# Patient Record
Sex: Female | Born: 1990 | Race: White | Hispanic: No | Marital: Single | Smoking: Current every day smoker
Health system: Southern US, Community
[De-identification: ages and names within clinical notes are randomized; demographics above are authoritative.]

## PROBLEM LIST (undated history)

## (undated) ENCOUNTER — Inpatient Hospital Stay (HOSPITAL_COMMUNITY): Payer: Self-pay

## (undated) DIAGNOSIS — Z789 Other specified health status: Secondary | ICD-10-CM

---
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
## 2012-06-25 ENCOUNTER — Encounter (HOSPITAL_COMMUNITY): Payer: Self-pay | Admitting: *Deleted

## 2012-06-25 ENCOUNTER — Emergency Department (HOSPITAL_COMMUNITY)
Admission: EM | Admit: 2012-06-25 | Discharge: 2012-06-25 | Disposition: A | Discharge status: Home / Self Care | Payer: Self-pay | Attending: Emergency Medicine | Admitting: Emergency Medicine

## 2012-06-25 DIAGNOSIS — Z79899 Other long term (current) drug therapy: Secondary | ICD-10-CM | POA: Insufficient documentation

## 2012-06-25 DIAGNOSIS — Z7982 Long term (current) use of aspirin: Secondary | ICD-10-CM | POA: Insufficient documentation

## 2012-06-25 DIAGNOSIS — F17200 Nicotine dependence, unspecified, uncomplicated: Secondary | ICD-10-CM | POA: Insufficient documentation

## 2012-06-25 DIAGNOSIS — R221 Localized swelling, mass and lump, neck: Secondary | ICD-10-CM | POA: Insufficient documentation

## 2012-06-25 DIAGNOSIS — K047 Periapical abscess without sinus: Secondary | ICD-10-CM | POA: Insufficient documentation

## 2012-06-25 DIAGNOSIS — R220 Localized swelling, mass and lump, head: Secondary | ICD-10-CM | POA: Insufficient documentation

## 2012-06-25 MED ORDER — OXYCODONE-ACETAMINOPHEN 5-325 MG PO TABS: 1.0000 | ORAL_TABLET | ORAL | Status: DC | PRN

## 2012-06-25 MED ORDER — LIDOCAINE HCL (PF) 1 % IJ SOLN: 2.0000 mL | Freq: Once | INTRAMUSCULAR | Status: DC

## 2012-06-25 MED ORDER — HYDROMORPHONE HCL PF 1 MG/ML IJ SOLN
1.0000 mg | Freq: Once | INTRAMUSCULAR | Status: AC
  Administered 2012-06-25: 1 mg via INTRAMUSCULAR
  Filled 2012-06-25: qty 1

## 2012-06-25 NOTE — ED Provider Notes (Signed)
History     CSN: [id number]  Arrival date & time 06/25/12  1101   First MD Initiated Contact with Patient 06/25/12 1107      Chief Complaint  Patient presents with  . Dental Pain  . Facial Swelling     Patient is a 22 y.o. female presenting with tooth pain. The history is provided by the patient.  Dental PainThe primary symptoms include mouth pain. Primary symptoms do not include fever or shortness of breath. The symptoms began 3 to 5 days ago. The symptoms are worsening. The symptoms are new. The symptoms occur constantly.  Additional symptoms include: facial swelling. Additional symptoms do not include: trouble swallowing.  pt reports right sided facial pain and swelling for past 3 days.  No visual changes.  No difficulty swallowing.  No fever.  No vomiting.  She reports she saw a dentist who put her on amoxicillin but she reports he didn't know what was going on.   History reviewed. No pertinent past medical history.  Past Surgical History  Procedure Laterality Date  . Cesarean section      No family history on file.  History  Substance Use Topics  . Smoking status: Current Every Day Smoker -- 0.50 packs/day    Types: Cigarettes  . Smokeless tobacco: Not on file  . Alcohol Use: Yes    OB History   Grav Para Term Preterm Abortions TAB SAB Ect Mult Living                  Review of Systems  Constitutional: Negative for fever.  HENT: Positive for facial swelling and dental problem. Negative for trouble swallowing.   Eyes: Negative for photophobia and visual disturbance.       No diplopia   Respiratory: Negative for shortness of breath.   Cardiovascular: Negative for chest pain.  Gastrointestinal: Negative for vomiting.  Neurological: Negative for weakness.  Psychiatric/Behavioral: Negative for agitation.  All other systems reviewed and are negative.    Allergies  Review of patient's allergies indicates no known allergies.  Home Medications   Current  Outpatient Rx  Name  Route  Sig  Dispense  Refill  . acetaminophen (TYLENOL) 500 MG tablet   Oral   Take 500 mg by mouth every 6 (six) hours as needed for pain.         . Aspirin-Salicylamide-Caffeine (BC HEADACHE POWDER PO)   Oral   Take 1 packet by mouth once.         Marland Kitchen HYDROcodone-acetaminophen (NORCO/VICODIN) 5-325 MG per tablet   Oral   Take 1 tablet by mouth every 6 (six) hours as needed for pain (for tooth pain.).         Marland Kitchen ibuprofen (ADVIL,MOTRIN) 800 MG tablet   Oral   Take 800 mg by mouth every 8 (eight) hours as needed for pain.           BP 142/98  Pulse 80  Temp(Src) 99.5 F (37.5 C) (Oral)  Ht 5\' 2"  (1.575 m)  SpO2 100%  LMP 06/01/2012  Physical Exam CONSTITUTIONAL: Well developed/well nourished HEAD AND FACE: Normocephalic/atraumatic EYES: EOMI/PERRL ENMT: Mucous membranes moist. No trismus.  Tenderness/swelling to right upper gingiva.  No active bleeding/drainage NECK: supple no meningeal signs CV: S1/S2 noted, no murmurs/rubs/gallops noted LUNGS: Lungs are clear to auscultation bilaterally, no apparent distress ABDOMEN: soft, nontender, no rebound or guarding NEURO: Pt is awake/alert, moves all extremitiesx4 EXTREMITIES: pulses normal, full ROM SKIN: warm, color normal PSYCH: no abnormalities  of mood noted  ED Course  INCISION AND DRAINAGE Date/Time: 06/25/2012 1:33 PM Performed by: Joya Gaskins Authorized by: Joya Gaskins Consent: Verbal consent obtained. Consent given by: patient Patient identity confirmed: verbally with patient Time out: Immediately prior to procedure a "time out" was called to verify the correct patient, procedure, equipment, support staff and site/side marked as required. Type: abscess Body area: head/neck (right upper gingival abscess) Anesthesia: nerve block Patient sedated: no Scalpel size: 11 Incision type: single straight Complexity: simple Drainage: purulent Drainage amount: moderate Wound  treatment: wound left open Patient tolerance: Patient tolerated the procedure well with no immediate complications.   NERVE BLOCK - PROCEDURE NOTE Right Infra-orbital nerve block Procedure explained to patient She agrees to procedure 3ml of lidocaine 1% without epinephrine injected Pt tolerated well with appropriate anesthesia No visual complaints reported     2:26 PM Pt tolerated procedure well, no distress Unable to contact dental.  She was given referral info.  Do not feel imaging is necessary at this time   MDM  Nursing notes including past medical history and social history reviewed and considered in documentation         Joya Gaskins, MD 06/25/12 1427

## 2012-06-25 NOTE — ED Notes (Signed)
Pt is here for follow up after increased right facial swelling.  Pt was seen here for dental pain two days ago and placed on antibiotics.  Pt has swelling to her right face, cheek and under her eye.  Pt also continues to have pain

## 2012-06-25 NOTE — ED Notes (Signed)
MD at bedside. 

## 2015-09-21 ENCOUNTER — Encounter (HOSPITAL_COMMUNITY): Payer: Self-pay | Admitting: Nurse Practitioner

## 2015-09-21 ENCOUNTER — Emergency Department (HOSPITAL_COMMUNITY)
Admission: EM | Admit: 2015-09-21 | Discharge: 2015-09-21 | Disposition: A | Discharge status: Home / Self Care | Payer: Self-pay | Attending: Emergency Medicine | Admitting: Emergency Medicine

## 2015-09-21 DIAGNOSIS — O99331 Smoking (tobacco) complicating pregnancy, first trimester: Secondary | ICD-10-CM | POA: Insufficient documentation

## 2015-09-21 DIAGNOSIS — Z3A00 Weeks of gestation of pregnancy not specified: Secondary | ICD-10-CM | POA: Insufficient documentation

## 2015-09-21 DIAGNOSIS — O200 Threatened abortion: Secondary | ICD-10-CM | POA: Insufficient documentation

## 2015-09-21 DIAGNOSIS — F17210 Nicotine dependence, cigarettes, uncomplicated: Secondary | ICD-10-CM | POA: Insufficient documentation

## 2015-09-21 LAB — BASIC METABOLIC PANEL
ANION GAP: 11 (ref 5–15)
CO2: 20 mmol/L — AB (ref 22–32)
CREATININE: 0.62 mg/dL (ref 0.44–1.00)
Calcium: 9.2 mg/dL (ref 8.9–10.3)
Chloride: 110 mmol/L (ref 101–111)
GFR calc non Af Amer: >60 mL/min (ref >60)
GLUCOSE: 90 mg/dL (ref 65–99)
POTASSIUM: 3.6 mmol/L (ref 3.5–5.1)
Sodium: 141 mmol/L (ref 135–145)

## 2015-09-21 LAB — CBC
HEMATOCRIT: 36.5 % (ref 36.0–46.0)
Hemoglobin: 12.7 g/dL (ref 12.0–15.0)
MCH: 31.4 pg (ref 26.0–34.0)
MCHC: 34.8 g/dL (ref 30.0–36.0)
MCV: 90.3 fL (ref 78.0–100.0)
Platelets: 331 10*3/uL (ref 150–400)
RBC: 4.04 MIL/uL (ref 3.87–5.11)
RDW: 11.9 % (ref 11.5–15.5)
WBC: 8.9 10*3/uL (ref 4.0–10.5)

## 2015-09-21 LAB — I-STAT BETA HCG BLOOD, ED (MC, WL, AP ONLY): I-stat hCG, quantitative: >2000 m[IU]/mL — ABNORMAL HIGH (ref <5)

## 2015-09-21 LAB — HCG, QUANTITATIVE, PREGNANCY: HCG, BETA CHAIN, QUANT, S: 12231 m[IU]/mL — AB (ref <5)

## 2015-09-21 NOTE — ED Notes (Signed)
Pt c/o onset severe abd cramps today. She was at her gyn office on Friday and they confirmed she was having a miscarriage. She began to have vaginal bleeding Friday that has persisted since. She became concerned today because of the severe abd cramping. She is alert and breathing easily

## 2015-09-21 NOTE — ED Notes (Signed)
Patient called again in waiting room with no response.

## 2015-09-21 NOTE — ED Notes (Signed)
Patient called in the waiting room and in restroom without response.

## 2016-02-12 ENCOUNTER — Encounter (HOSPITAL_COMMUNITY): Payer: Self-pay | Admitting: Emergency Medicine

## 2016-02-12 ENCOUNTER — Emergency Department (HOSPITAL_COMMUNITY): Payer: Self-pay

## 2016-02-12 ENCOUNTER — Emergency Department (HOSPITAL_COMMUNITY)
Admission: EM | Admit: 2016-02-12 | Discharge: 2016-02-12 | Disposition: A | Discharge status: Home / Self Care | Payer: Self-pay | Attending: Emergency Medicine | Admitting: Emergency Medicine

## 2016-02-12 DIAGNOSIS — Y92410 Unspecified street and highway as the place of occurrence of the external cause: Secondary | ICD-10-CM | POA: Insufficient documentation

## 2016-02-12 DIAGNOSIS — S301XXA Contusion of abdominal wall, initial encounter: Secondary | ICD-10-CM | POA: Insufficient documentation

## 2016-02-12 DIAGNOSIS — F17210 Nicotine dependence, cigarettes, uncomplicated: Secondary | ICD-10-CM | POA: Insufficient documentation

## 2016-02-12 DIAGNOSIS — Y999 Unspecified external cause status: Secondary | ICD-10-CM | POA: Insufficient documentation

## 2016-02-12 DIAGNOSIS — Y939 Activity, unspecified: Secondary | ICD-10-CM | POA: Insufficient documentation

## 2016-02-12 DIAGNOSIS — R0602 Shortness of breath: Secondary | ICD-10-CM | POA: Insufficient documentation

## 2016-02-12 DIAGNOSIS — R51 Headache: Secondary | ICD-10-CM | POA: Insufficient documentation

## 2016-02-12 DIAGNOSIS — S0093XA Contusion of unspecified part of head, initial encounter: Secondary | ICD-10-CM | POA: Insufficient documentation

## 2016-02-12 DIAGNOSIS — S20219A Contusion of unspecified front wall of thorax, initial encounter: Secondary | ICD-10-CM | POA: Insufficient documentation

## 2016-02-12 DIAGNOSIS — Z7982 Long term (current) use of aspirin: Secondary | ICD-10-CM | POA: Insufficient documentation

## 2016-02-12 DIAGNOSIS — M7918 Myalgia, other site: Secondary | ICD-10-CM

## 2016-02-12 LAB — CBC WITH DIFFERENTIAL/PLATELET
BASOS ABS: 0 10*3/uL (ref 0.0–0.1)
BASOS PCT: 1 %
EOS PCT: 2 %
Eosinophils Absolute: 0.2 10*3/uL (ref 0.0–0.7)
HEMATOCRIT: 37.5 % (ref 36.0–46.0)
Hemoglobin: 12.8 g/dL (ref 12.0–15.0)
Lymphocytes Relative: 40 %
Lymphs Abs: 3.2 10*3/uL (ref 0.7–4.0)
MCH: 31.4 pg (ref 26.0–34.0)
MCHC: 34.1 g/dL (ref 30.0–36.0)
MCV: 92.1 fL (ref 78.0–100.0)
MONO ABS: 0.6 10*3/uL (ref 0.1–1.0)
MONOS PCT: 7 %
NEUTROS ABS: 4 10*3/uL (ref 1.7–7.7)
Neutrophils Relative %: 50 %
PLATELETS: 269 10*3/uL (ref 150–400)
RBC: 4.07 MIL/uL (ref 3.87–5.11)
RDW: 12.4 % (ref 11.5–15.5)
WBC: 7.9 10*3/uL (ref 4.0–10.5)

## 2016-02-12 LAB — BASIC METABOLIC PANEL
Anion gap: 3 — ABNORMAL LOW (ref 5–15)
BUN: 8 mg/dL (ref 6–20)
CALCIUM: 8.7 mg/dL — AB (ref 8.9–10.3)
CO2: 25 mmol/L (ref 22–32)
CREATININE: 0.54 mg/dL (ref 0.44–1.00)
Chloride: 108 mmol/L (ref 101–111)
GLUCOSE: 87 mg/dL (ref 65–99)
Potassium: 3.8 mmol/L (ref 3.5–5.1)
Sodium: 136 mmol/L (ref 135–145)

## 2016-02-12 LAB — POC URINE PREG, ED: PREG TEST UR: NEGATIVE

## 2016-02-12 MED ORDER — KETOROLAC TROMETHAMINE 30 MG/ML IJ SOLN
15.0000 mg | Freq: Once | INTRAMUSCULAR | Status: AC
  Administered 2016-02-12: 15 mg via INTRAVENOUS
  Filled 2016-02-12: qty 1

## 2016-02-12 MED ORDER — HYDROCODONE-ACETAMINOPHEN 5-325 MG PO TABS: 1.0000 | ORAL_TABLET | ORAL | 0 refills | Status: DC | PRN

## 2016-02-12 MED ORDER — IBUPROFEN 600 MG PO TABS: 600.0000 mg | ORAL_TABLET | Freq: Four times a day (QID) | ORAL | 0 refills | Status: DC | PRN

## 2016-02-12 MED ORDER — IOPAMIDOL (ISOVUE-300) INJECTION 61%
100.0000 mL | Freq: Once | INTRAVENOUS | Status: AC | PRN
  Administered 2016-02-12: 100 mL via INTRAVENOUS

## 2016-02-12 NOTE — Discharge Instructions (Signed)
You may take the hydrocodone prescribed for pain relief.  This will make you drowsy - do not drive within 4 hours of taking this medication. ° °
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
## 2016-05-13 HISTORY — DX: Maternal care for unspecified type scar from previous cesarean delivery: O34.219

## 2017-03-24 DIAGNOSIS — O99013 Anemia complicating pregnancy, third trimester: Secondary | ICD-10-CM | POA: Insufficient documentation

## 2017-03-24 DIAGNOSIS — O99331 Smoking (tobacco) complicating pregnancy, first trimester: Secondary | ICD-10-CM

## 2017-03-24 DIAGNOSIS — O34219 Maternal care for unspecified type scar from previous cesarean delivery: Secondary | ICD-10-CM | POA: Insufficient documentation

## 2017-03-24 HISTORY — DX: Anemia complicating pregnancy, third trimester: O99.013

## 2017-03-24 HISTORY — DX: Smoking (tobacco) complicating pregnancy, first trimester: O99.331

## 2017-04-07 DIAGNOSIS — O365930 Maternal care for other known or suspected poor fetal growth, third trimester, not applicable or unspecified: Secondary | ICD-10-CM

## 2017-04-07 HISTORY — DX: Maternal care for other known or suspected poor fetal growth, third trimester, not applicable or unspecified: O36.5930

## 2017-05-02 ENCOUNTER — Inpatient Hospital Stay (HOSPITAL_COMMUNITY): Payer: Medicaid Other

## 2017-05-02 ENCOUNTER — Encounter (HOSPITAL_COMMUNITY): Payer: Self-pay | Admitting: *Deleted

## 2017-05-02 ENCOUNTER — Inpatient Hospital Stay (HOSPITAL_COMMUNITY)
Admission: AD | Admit: 2017-05-02 | Discharge: 2017-05-03 | Disposition: A | Discharge status: Home / Self Care | Payer: Medicaid Other | Source: Ambulatory Visit | Attending: Obstetrics & Gynecology | Admitting: Obstetrics & Gynecology

## 2017-05-02 ENCOUNTER — Other Ambulatory Visit: Payer: Self-pay

## 2017-05-02 DIAGNOSIS — Z3A36 36 weeks gestation of pregnancy: Secondary | ICD-10-CM | POA: Insufficient documentation

## 2017-05-02 DIAGNOSIS — R1031 Right lower quadrant pain: Secondary | ICD-10-CM | POA: Diagnosis not present

## 2017-05-02 DIAGNOSIS — O34219 Maternal care for unspecified type scar from previous cesarean delivery: Secondary | ICD-10-CM | POA: Insufficient documentation

## 2017-05-02 DIAGNOSIS — O99333 Smoking (tobacco) complicating pregnancy, third trimester: Secondary | ICD-10-CM | POA: Diagnosis not present

## 2017-05-02 DIAGNOSIS — R109 Unspecified abdominal pain: Secondary | ICD-10-CM

## 2017-05-02 DIAGNOSIS — O26893 Other specified pregnancy related conditions, third trimester: Secondary | ICD-10-CM | POA: Diagnosis present

## 2017-05-02 DIAGNOSIS — M549 Dorsalgia, unspecified: Secondary | ICD-10-CM | POA: Diagnosis not present

## 2017-05-02 DIAGNOSIS — F17210 Nicotine dependence, cigarettes, uncomplicated: Secondary | ICD-10-CM | POA: Diagnosis not present

## 2017-05-02 HISTORY — DX: Other specified health status: Z78.9

## 2017-05-02 LAB — URINALYSIS, ROUTINE W REFLEX MICROSCOPIC
Glucose, UA: NEGATIVE mg/dL
HGB URINE DIPSTICK: NEGATIVE
Ketones, ur: 15 mg/dL — AB
NITRITE: NEGATIVE
PH: 6 (ref 5.0–8.0)
Protein, ur: NEGATIVE mg/dL
Specific Gravity, Urine: 1.025 (ref 1.005–1.030)

## 2017-05-02 LAB — URINALYSIS, MICROSCOPIC (REFLEX)

## 2017-05-02 LAB — CBC WITH DIFFERENTIAL/PLATELET
BASOS PCT: 0 %
Basophils Absolute: 0 10*3/uL (ref 0.0–0.1)
EOS ABS: 0.1 10*3/uL (ref 0.0–0.7)
EOS PCT: 1 %
HCT: 32.4 % — ABNORMAL LOW (ref 36.0–46.0)
HEMOGLOBIN: 11.2 g/dL — AB (ref 12.0–15.0)
LYMPHS ABS: 3.8 10*3/uL (ref 0.7–4.0)
Lymphocytes Relative: 23 %
MCH: 30.6 pg (ref 26.0–34.0)
MCHC: 34.6 g/dL (ref 30.0–36.0)
MCV: 88.5 fL (ref 78.0–100.0)
Monocytes Absolute: 0.4 10*3/uL (ref 0.1–1.0)
Monocytes Relative: 2 %
NEUTROS PCT: 74 %
Neutro Abs: 11.9 10*3/uL — ABNORMAL HIGH (ref 1.7–7.7)
PLATELETS: 359 10*3/uL (ref 150–400)
RBC: 3.66 MIL/uL — AB (ref 3.87–5.11)
RDW: 12.9 % (ref 11.5–15.5)
WBC: 16.2 10*3/uL — ABNORMAL HIGH (ref 4.0–10.5)

## 2017-05-02 MED ORDER — HYDROMORPHONE HCL 1 MG/ML IJ SOLN
0.5000 mg | Freq: Once | INTRAMUSCULAR | Status: AC
  Administered 2017-05-02: 0.5 mg via INTRAVENOUS
  Filled 2017-05-02: qty 1

## 2017-05-02 MED ORDER — CYCLOBENZAPRINE HCL 10 MG PO TABS
10.0000 mg | ORAL_TABLET | Freq: Once | ORAL | Status: AC
  Administered 2017-05-02: 10 mg via ORAL
  Filled 2017-05-02: qty 1

## 2017-05-02 MED ORDER — LACTATED RINGERS IV BOLUS (SEPSIS)
1000.0000 mL | Freq: Once | INTRAVENOUS | Status: AC
  Administered 2017-05-02: 1000 mL via INTRAVENOUS
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
## 2017-05-02 NOTE — MAU Provider Note (Signed)
Chief Complaint:  Abdominal Pain and Back Pain   First Provider Initiated Contact with Patient 05/02/17 2058     HPI  HPI: Anna Harrison is a 26 y.o. [id number] at [id number] presents to maternity admissions reporting abdominal pain. Symptoms began 1 hour PTA while she was driving. Initially pain was constant but states now is intermittent. Pain in RLQ & radiates too right low back & right flank. Rates pain 7/10. Has not treated symptoms. Nothing makes better or worse. Feels abdominal tightening with pain. She reports good fetal movement, denies LOF, vaginal bleeding, vaginal itching/burning, urinary symptoms, h/a, dizziness, n/v, diarrhea, constipation or fever/chills.   Receives prenatal care in [place].   Past Medical History: Past Medical History:  Diagnosis Date  . Medical history non-contributory     Past obstetric history: OB History  Gravida Para Term Preterm AB Living  4 2 2   1 2   SAB TAB Ectopic Multiple Live Births  1       2    # Outcome Date GA Lbr Len/2nd Weight Sex Delivery Anes PTL Lv  4 Current           3 SAB 2017          2 Term 11/02/11    Wandalee FerdinandM CS-LTranv   LIV  1 Term 03/23/07    F Vag-Spont   LIV      Past Surgical History: Past Surgical History:  Procedure Laterality Date  . CESAREAN SECTION  2013    Family History: No family history on file.  Social History: Social History   Tobacco Use  . Smoking status: Current Every Day Smoker    Packs/day: 0.50    Types: Cigarettes  . Smokeless tobacco: Never Used  Substance Use Topics  . Alcohol use: Yes    Comment: occassionally  . Drug use: No    Allergies: No Known Allergies  Meds:  No medications prior to admission.    I have reviewed patient's Past Medical Hx, Surgical Hx, Family Hx, Social Hx, medications and allergies.   ROS:  Review of Systems  Constitutional: Negative.   Gastrointestinal: Positive for abdominal pain. Negative for constipation, diarrhea, nausea and vomiting.  Genitourinary:  Positive for flank pain. Negative for dysuria, frequency, hematuria, vaginal bleeding and vaginal discharge.  Musculoskeletal: Positive for back pain.   Other systems negative  Physical Exam   Patient Vitals for the past 24 hrs:  BP Temp Temp src Pulse Resp SpO2 Height Weight  05/03/17 0037 115/69 97.9 F (36.6 C) Oral 87 18 - - -  05/02/17 2339 (!) 108/54 98.1 F (36.7 C) Oral 83 18 - - -  05/02/17 2140 123/71 (!) 97.3 F (36.3 C) Oral - 18 - - -  05/02/17 2053 - - - - - 98 % - -  05/02/17 2038 128/75 98.6 F (37 C) Oral 97 18 - 5\' 2"  (1.575 m) 214 lb (97.1 kg)   Constitutional: Well-developed, well-nourished female in no acute distress.  Cardiovascular: normal rate and rhythm Respiratory: normal effort, clear to auscultation bilaterally GI: Abd TTP in RLQ & right side, no CVAT. No rebound or guarding. Mild contractions palpated.  MS: Extremities nontender, no edema, normal ROM Neurologic: Alert and oriented x 4.  GU: Neg CVAT.  Dilation: Fingertip Effacement (%): Thick Cervical Position: Posterior Exam by:: Judeth HornErin Cara Aguino NP  FHT:  Baseline 130 , moderate variability, accelerations present, no decelerations Contractions: q 3-5 mins, Irregular     Labs: Results for orders placed or  performed during the hospital encounter of 05/02/17 (from the past 24 hour(s))  Urinalysis, Routine w reflex microscopic     Status: Abnormal   Collection Time: 05/02/17  8:39 PM  Result Value Ref Range   Color, Urine YELLOW YELLOW   APPearance CLEAR CLEAR   Specific Gravity, Urine 1.025 1.005 - 1.030   pH 6.0 5.0 - 8.0   Glucose, UA NEGATIVE NEGATIVE mg/dL   Hgb urine dipstick NEGATIVE NEGATIVE   Bilirubin Urine SMALL (A) NEGATIVE   Ketones, ur 15 (A) NEGATIVE mg/dL   Protein, ur NEGATIVE NEGATIVE mg/dL   Nitrite NEGATIVE NEGATIVE   Leukocytes, UA SMALL (A) NEGATIVE  Urinalysis, Microscopic (reflex)     Status: Abnormal   Collection Time: 05/02/17  8:39 PM  Result Value Ref Range    RBC / HPF 0-5 0 - 5 RBC/hpf   WBC, UA 0-5 0 - 5 WBC/hpf   Bacteria, UA FEW (A) NONE SEEN   Squamous Epithelial / LPF 6-30 (A) NONE SEEN  CBC with Differential/Platelet     Status: Abnormal   Collection Time: 05/02/17  9:07 PM  Result Value Ref Range   WBC 16.2 (H) 4.0 - 10.5 K/uL   RBC 3.66 (L) 3.87 - 5.11 MIL/uL   Hemoglobin 11.2 (L) 12.0 - 15.0 g/dL   HCT 16.132.4 (L) 09.636.0 - 04.546.0 %   MCV 88.5 78.0 - 100.0 fL   MCH 30.6 26.0 - 34.0 pg   MCHC 34.6 30.0 - 36.0 g/dL   RDW 40.912.9 81.111.5 - 91.415.5 %   Platelets 359 150 - 400 K/uL   Neutrophils Relative % 74 %   Neutro Abs 11.9 (H) 1.7 - 7.7 K/uL   Lymphocytes Relative 23 %   Lymphs Abs 3.8 0.7 - 4.0 K/uL   Monocytes Relative 2 %   Monocytes Absolute 0.4 0.1 - 1.0 K/uL   Eosinophils Relative 1 %   Eosinophils Absolute 0.1 0.0 - 0.7 K/uL   Basophils Relative 0 %   Basophils Absolute 0.0 0.0 - 0.1 K/uL      Imaging:  [place] Renal  Result Date: 05/02/2017 CLINICAL DATA:  26 year old female with right flank pain. EXAM: RENAL / URINARY TRACT ULTRASOUND COMPLETE COMPARISON:  Abdominal CT dated 02/12/2016 FINDINGS: Right Kidney: Length: 12.4 cm. Normal echogenicity. No hydronephrosis or echogenic stone. Left Kidney: Length: 12.3 cm. Normal echogenicity. No hydronephrosis or echogenic stone. Bladder: The urinary bladder is collapsed. IMPRESSION: Unremarkable renal ultrasound. Electronically Signed   By: Elgie CollardArash  Radparvar M.D.   On: 05/02/2017 23:40    MAU Course/MDM: Reactive NST. SVE fingertip/thick/posterior VSS, pt afebrile Renal ultrasound & OB limited ultrasound normal IV fluids & pain medication given resulting in improvement in symptoms   Assessment: 1. [redacted] weeks gestation of pregnancy   2. Abdominal pain during pregnancy, third trimester   3. Right flank pain     Plan: Discharge home Labor precautions and fetal kick counts Follow up in Office for prenatal visits and recheck If worsening symptoms or fever develops, go to nearest  hospital  Pt stable at time of discharge.  Judeth HornErin Cheyne Bungert, FNP 05/03/2017 1:45 AM

## 2017-05-02 NOTE — MAU Note (Signed)
End of shift report - given to Benj, RN.

## 2017-05-03 ENCOUNTER — Encounter (HOSPITAL_COMMUNITY): Payer: Self-pay | Admitting: Student

## 2017-05-03 NOTE — Discharge Instructions (Signed)

## 2017-08-20 ENCOUNTER — Encounter (HOSPITAL_COMMUNITY): Payer: Self-pay

## 2017-12-17 DIAGNOSIS — K0889 Other specified disorders of teeth and supporting structures: Secondary | ICD-10-CM

## 2017-12-17 HISTORY — DX: Other specified disorders of teeth and supporting structures: K08.89

## 2018-02-24 ENCOUNTER — Encounter: Payer: Self-pay | Admitting: Adult Health

## 2018-03-11 ENCOUNTER — Other Ambulatory Visit: Payer: Self-pay

## 2018-03-11 ENCOUNTER — Encounter (HOSPITAL_COMMUNITY): Payer: Self-pay

## 2018-03-11 ENCOUNTER — Inpatient Hospital Stay (HOSPITAL_COMMUNITY)
Admission: AD | Admit: 2018-03-11 | Discharge: 2018-03-11 | Disposition: A | Discharge status: Home / Self Care | Payer: Medicaid Other | Source: Ambulatory Visit | Attending: Obstetrics and Gynecology | Admitting: Obstetrics and Gynecology

## 2018-03-11 DIAGNOSIS — N939 Abnormal uterine and vaginal bleeding, unspecified: Secondary | ICD-10-CM

## 2018-03-11 DIAGNOSIS — R102 Pelvic and perineal pain: Secondary | ICD-10-CM | POA: Diagnosis present

## 2018-03-11 DIAGNOSIS — F17210 Nicotine dependence, cigarettes, uncomplicated: Secondary | ICD-10-CM | POA: Diagnosis not present

## 2018-03-11 DIAGNOSIS — Z7982 Long term (current) use of aspirin: Secondary | ICD-10-CM | POA: Insufficient documentation

## 2018-03-11 DIAGNOSIS — N926 Irregular menstruation, unspecified: Secondary | ICD-10-CM | POA: Insufficient documentation

## 2018-03-11 LAB — CBC
HCT: 39.4 % (ref 36.0–46.0)
Hemoglobin: 13.3 g/dL (ref 12.0–15.0)
MCH: 31.2 pg (ref 26.0–34.0)
MCHC: 33.8 g/dL (ref 30.0–36.0)
MCV: 92.5 fL (ref 80.0–100.0)
PLATELETS: 339 10*3/uL (ref 150–400)
RBC: 4.26 MIL/uL (ref 3.87–5.11)
RDW: 13.3 % (ref 11.5–15.5)
WBC: 12.1 10*3/uL — ABNORMAL HIGH (ref 4.0–10.5)

## 2018-03-11 LAB — POCT PREGNANCY, URINE: Preg Test, Ur: NEGATIVE

## 2018-03-11 LAB — URINALYSIS, ROUTINE W REFLEX MICROSCOPIC
BILIRUBIN URINE: NEGATIVE
GLUCOSE, UA: NEGATIVE mg/dL
Hgb urine dipstick: NEGATIVE
KETONES UR: NEGATIVE mg/dL
Leukocytes, UA: NEGATIVE
NITRITE: NEGATIVE
PH: 6 (ref 5.0–8.0)
PROTEIN: NEGATIVE mg/dL
Specific Gravity, Urine: 1.02 (ref 1.005–1.030)

## 2018-03-11 LAB — HCG, QUANTITATIVE, PREGNANCY: hCG, Beta Chain, Quant, S: <1 m[IU]/mL (ref <5)

## 2018-03-11 LAB — TSH: TSH: 1.083 u[IU]/mL (ref 0.350–4.500)

## 2018-03-11 NOTE — MAU Provider Note (Signed)
Chief Complaint:  No chief complaint on file.   First Provider Initiated Contact with Patient 03/11/18 2142      HPI: Anna Harrison is a 27 y.o. [id number] who presents to maternity admissions reporting thinking she is pregnant.  Has missed her period for 2 months, states this is very unusual.  Bled last month but states was not period.  She has no vaginal itching/burning, urinary symptoms, h/a, dizziness, n/v, or fever/chills.    Wants an ultrasound to verify pregnancy, even though tests are negative.  States with last pregnancy she was 4 months along before her blood pregnancy test ever became positive.  States hormones never show up in her blood until after 4 months.  States has some cramping at times, is worried shehas an ovarian cyst.   Abdominal Pain  This is a new problem. The current episode started today. The onset quality is gradual. The problem occurs intermittently. The pain is mild. The quality of the pain is cramping. Pertinent negatives include no constipation, diarrhea, fever or headaches. Nothing aggravates the pain. The pain is relieved by nothing. She has tried nothing for the symptoms.   RN Note: Pt states that she has not had a period in 2 months. She states she has had some bleeding last month that she says was not her period.  Pt states that she never had a + pregnancy test with her first child so she is concerned she is pregnant, but maybe just isn't showing up on a pregnancy test.   Past Medical History: Past Medical History:  Diagnosis Date  . Medical history non-contributory     Past obstetric history: OB History  Gravida Para Term Preterm AB Living  4 3 3   1 3   SAB TAB Ectopic Multiple Live Births  1       3    # Outcome Date GA Lbr Len/2nd Weight Sex Delivery Anes PTL Lv  4 Term 2019     CS-Unspec   LIV  3 SAB 2017          2 Term 11/02/11    Wandalee Ferdinand   LIV  1 Term 03/23/07    F Vag-Spont   LIV    Past Surgical History: Past Surgical History:   Procedure Laterality Date  . CESAREAN SECTION  2013    Family History: Family History  Problem Relation Age of Onset  . Hypertension Mother   . COPD Mother   . Diabetes Mother   . Heart disease Mother   . Hypertension Sister     Social History: Social History   Tobacco Use  . Smoking status: Current Every Day Smoker    Packs/day: 0.50    Types: Cigarettes  . Smokeless tobacco: Never Used  Substance Use Topics  . Alcohol use: Yes    Comment: occassionally  . Drug use: No    Allergies: No Known Allergies  Meds:  Medications Prior to Admission  Medication Sig Dispense Refill Last Dose  . acetaminophen (TYLENOL) 500 MG tablet Take 500 mg by mouth every 6 (six) hours as needed for pain.   06/23/12  . Aspirin-Salicylamide-Caffeine (BC HEADACHE POWDER PO) Take 1 packet by mouth once.   06/22/12  . HYDROcodone-acetaminophen (NORCO/VICODIN) 5-325 MG tablet Take 1 tablet by mouth every 4 (four) hours as needed. 20 tablet 0   . oxyCODONE-acetaminophen (PERCOCET/ROXICET) 5-325 MG per tablet Take 1 tablet by mouth every 4 (four) hours as needed for pain. 15 tablet 0  I have reviewed patient's Past Medical Hx, Surgical Hx, Family Hx, Social Hx, medications and allergies.  ROS:  Review of Systems  Constitutional: Negative for fever.  Gastrointestinal: Positive for abdominal pain. Negative for constipation and diarrhea.  Neurological: Negative for headaches.   Other systems negative     Physical Exam   Patient Vitals for the past 24 hrs:  BP Temp Pulse Resp SpO2 Weight  03/11/18 2106 120/74 99.3 F (37.4 C) 92 12 99 % -  03/11/18 2053 - - - - - 101.7 kg   Constitutional: Well-developed, well-nourished female in no acute distress.  Cardiovascular: normal rate and rhythm, no ectopy audible, S1 & S2 heard, no murmur Respiratory: normal effort, no distress. Lungs CTAB with no wheezes or crackles GI: Abd soft, non-tender.  Nondistended.  No rebound, No guarding.  Bowel  Sounds audible  MS: Extremities nontender, no edema, normal ROM Neurologic: Alert and oriented x 4.   Grossly nonfocal. GU: Neg CVAT. Skin:  Warm and Dry Psych:  Affect appropriate.  PELVIC EXAM:  Cervix long and closed.  Uterus is small and firm, nontender.  Adnexa slightly tender on left.    Labs: Results for orders placed or performed during the hospital encounter of 03/11/18 (from the past 24 hour(s))  Urinalysis, Routine w reflex microscopic     Status: None   Collection Time: 03/11/18  8:58 PM  Result Value Ref Range   Color, Urine YELLOW YELLOW   APPearance CLEAR CLEAR   Specific Gravity, Urine 1.020 1.005 - 1.030   pH 6.0 5.0 - 8.0   Glucose, UA NEGATIVE NEGATIVE mg/dL   Hgb urine dipstick NEGATIVE NEGATIVE   Bilirubin Urine NEGATIVE NEGATIVE   Ketones, ur NEGATIVE NEGATIVE mg/dL   Protein, ur NEGATIVE NEGATIVE mg/dL   Nitrite NEGATIVE NEGATIVE   Leukocytes, UA NEGATIVE NEGATIVE  Pregnancy, urine POC     Status: None   Collection Time: 03/11/18  9:00 PM  Result Value Ref Range   Preg Test, Ur NEGATIVE NEGATIVE  CBC     Status: Abnormal   Collection Time: 03/11/18 10:09 PM  Result Value Ref Range   WBC 12.1 (H) 4.0 - 10.5 K/uL   RBC 4.26 3.87 - 5.11 MIL/uL   Hemoglobin 13.3 12.0 - 15.0 g/dL   HCT 16.1 09.6 - 04.5 %   MCV 92.5 80.0 - 100.0 fL   MCH 31.2 26.0 - 34.0 pg   MCHC 33.8 30.0 - 36.0 g/dL   RDW 40.9 81.1 - 91.4 %   Platelets 339 150 - 400 K/uL  TSH     Status: None   Collection Time: 03/11/18 10:09 PM  Result Value Ref Range   TSH 1.083 0.350 - 4.500 uIU/mL  hCG, quantitative, pregnancy     Status: None   Collection Time: 03/11/18 10:09 PM  Result Value Ref Range   hCG, Beta Chain, Quant, S <1 <5 mIU/mL    Imaging:  No results found.  MAU Course/MDM: I have ordered labs as follows:  See above.  Quant HCG is negative.  Patient insists she is pregnant Imaging ordered: outpatient ultrasound to evaluate for cause of amenorrhea Results reviewed.     Treatments in MAU included none.   Pt stable at time of discharge.  Assessment: Abnormal bleeding pattern Pelvic cramping Desire to be pregnant (just had a baby a year ago, states she wants her partner to have his own baby)  Plan: Discharge home Recommend testing TSH to evaluate adequacy of thyroid hormone (normal)  WIll order outpatient pelvic US Advised needs to set up outpatient clinic appointment for followup  Wants to go to [facility] Discussed impossible to be pregnant with a negative serum pregnancy test advised start prenatal vitamins   Encouraged to return here or to other Urgent Care/ED if she develops worsening of symptoms, increase in pain, fever, or other concerning symptoms.   Wynelle Bourgeois CNM, MSN Certified Nurse-Midwife 03/11/2018 9:42 PM

## 2018-03-11 NOTE — MAU Note (Signed)
Pt states that she has not had a period in 2 months. She states she has had some bleeding last month that she says was not her period.   Pt states that she never had a + pregnancy test with her first child so she is concerned she is pregnant, but maybe just isn't showing up on a pregnancy test.

## 2018-03-11 NOTE — Discharge Instructions (Signed)
Human Chorionic Gonadotropin Test °Human chorionic gonadotropin (hCG) is a hormone produced during pregnancy by the cells that form the placenta. The placenta is the organ that grows inside your womb (uterus) to nourish a developing baby. When you are pregnant, hCG starts to appear in your blood about 11 days after conception. It continues to go up for the first 8-11 weeks of pregnancy. °Your hCG level can be measured with several different types of tests. You may have: °· A urine test. °? hCG is eliminated from your body by your kidneys, so a urine test is one way to check for this hormone. °? A urine test only shows whether there is hCG in your urine. It does not measure how much. °? You may have a urine test to find out whether you are pregnant. °? A home pregnancy test detects whether there is hCG in your urine. °· A qualitative blood test. °? Like the urine test, this blood test only shows whether there is hCG in your blood. It does not measure how much. °? You may have this type of blood test to find out whether you are pregnant. °· A quantitative blood test. °? This type of blood test measures the amount of hCG in your blood. °? You may have this type of test to diagnose an abnormal pregnancy or determine whether you are at risk of, or have had, a failed pregnancy (miscarriage). ° °How do I prepare for this test? °For the urine test: °· Limit your fluid intake before the urine test as directed by your health care provider. °· Collect the sample the first time you urinate in the morning. °· Let your health care provider know if you have blood in your urine. This may interfere with the test result. ° °Some medicines may interfere with the urine and blood tests. Let your health care provider know about all the medicines you are taking. No additional preparation is required for the blood test. °What do the results mean? °It is your responsibility to obtain your test results. Ask the lab or department performing  the test when and how you will get your results. Talk to your health care provider if you have any questions about your test results. °The results of the hCG urine test and the qualitative hCG blood test are either positive or negative. The results of the quantitative hCG blood test are reported as a number. hCG is measured in international units per liter (IU/L). °Meaning of Negative Test Results °A negative result on a urine or qualitative blood test could mean that you are not pregnant. It could also mean the test was done too early to detect hCG. If you still have other signs of pregnancy, the test should be repeated. °Meaning of Positive Test Results °A positive result on the urine or qualitative blood tests means you are most likely pregnant. Your health care provider may confirm your pregnancy with an imaging study of the inside of your uterus at 5-6 weeks (ultrasound). °Range of Normal Values °Ranges for normal values for the quantitative hCG blood test may vary among different labs and hospitals. You should always check with your health care provider after having lab work or other tests done to discuss whether your values are considered within normal limits. °· Less than 5 IU/L means it is most likely you are not pregnant. °· Greater than 25 IU/L means it is most likely you are pregnant. ° °Meaning of Results Outside Normal Value Ranges °If your hCG   level on the quantitative test is not what would be expected, you may have the test again. It may also be important for your health care provider to know whether your hCG level goes up or down over time. Common causes of results outside the normal range include:  Being pregnant with twins (hCG level is higher than expected).  Having an ectopic pregnancy (hCG rises more slowly than expected).  Miscarriage (hCG level falls).  Abnormal growths in the womb (hCG level is higher than expected).  Talk with your health care provider to discuss your results,  treatment options, and if necessary, the need for more tests. Talk with your health care provider if you have any questions about your results. This information is not intended to replace advice given to you by your health care provider. Make sure you discuss any questions you have with your health care provider. Document Released: 05/31/2004 Document Revised: 01/03/2016 Document Reviewed: 08/03/2013 Elsevier Interactive Patient Education  2018 [employer]. Thyroid-Stimulating Hormone Test Why am I having this test? A thyroid-stimulating hormone (TSH) test is a blood test that is done to measure the level of TSH, also known as thyrotropin, in your blood. TSH is produced by the pituitary gland. The pituitary gland is a small organ located just below the brain, behind your eyes and nasal passages. It is part of a system that monitors and maintains thyroid hormone levels and thyroid gland function. Thyroid hormones affect many body parts and systems, including the system that affects how quickly your body burns fuel for energy. Your health care provider may recommend testing your TSH level if you have signs and symptoms of abnormal thyroid hormone levels. Knowing the level of TSH in your blood can help your health care provider:  Diagnose a thyroid gland or pituitary gland disorder.  Manage your condition and treatment if you have hypothyroidism or hyperthyroidism.  What kind of sample is taken? A blood sample is required for this test. It is usually collected by inserting a needle into a vein. How do I prepare for this test? There is no preparation required for this test. What are the reference ranges? Reference rangesare considered healthy rangesestablished after testing a large group of healthy people. Reference rangesmay vary among different people, labs, and hospitals. It is your responsibility to obtain your test results. Ask the lab or department performing the test when and how you will  get your results. Range of Normal Values:  Adult: 0.3-5 microunits/mL or 0.3-5 milliunits/L (SI units).  Newborn: 72-18 microunits/mL or 3-18 milliunits/L.  Cord: 3-12 microunits/mL or 3-12 milliunits/L.  What do the results mean? A high level of TSH may mean:  Your thyroid gland is not making enough thyroid hormones. When the thyroid gland does not make enough thyroid hormones, the pituitary gland releases TSH into the bloodstream. The higher-than-normal levels of TSH prompt the thyroid gland to release more thyroid hormones.  You are getting an insufficient level of thyroid hormone medicine, if you are receiving this type of treatment.  There is a problem with the pituitary gland (rare).  A low level of TSH can indicate a problem with the pituitary gland. Talk with your health care provider to discuss your results, treatment options, and if necessary, the need for more tests. Talk with your health care provider if you have any questions about your results. Talk with your health care provider to discuss your results, treatment options, and if necessary, the need for more tests. Talk with your health care  provider if you have any questions about your results. This information is not intended to replace advice given to you by your health care provider. Make sure you discuss any questions you have with your health care provider. Document Released: 05/24/2004 Document Revised: 12/31/2015 Document Reviewed: 09/22/2013 Elsevier Interactive Patient Education  2018 [employer] Abdominal or Pelvic Ultrasound An ultrasound is a test that takes pictures of the inside of the body. An abdominal ultrasound takes pictures of your belly. A pelvic ultrasound takes pictures of the area between your belly and thighs. An ultrasound may be done to check an organ or look for problems. If you are pregnant, it may be done to learn about your baby. What happens before the procedure?  Follow instructions from  your doctor about what you cannot eat or drink.  Wear clothing that is easy to wash. Gel from the test might get on your clothes. What happens during the procedure?  A gel will be put on your skin. It may feel cool.  A wand called a transducer will be put on your skin.  The wand will take pictures. They will show on small TV screens. What happens after the procedure?  Get your test results. Ask your doctor or the department that did the test when your results will be ready.  Keep follow-up visits as told by your doctor. This is important. This information is not intended to replace advice given to you by your health care provider. Make sure you discuss any questions you have with your health care provider. Document Released: 06/01/2010 Document Revised: 12/28/2015 Document Reviewed: 01/20/2015 Elsevier Interactive Patient Education  [employer].

## 2018-03-20 ENCOUNTER — Ambulatory Visit (HOSPITAL_COMMUNITY)
Admission: RE | Admit: 2018-03-20 | Discharge: 2018-03-20 | Disposition: A | Discharge status: Home / Self Care | Payer: Medicaid Other | Source: Ambulatory Visit | Attending: Advanced Practice Midwife | Admitting: Advanced Practice Midwife

## 2018-03-20 DIAGNOSIS — R102 Pelvic and perineal pain: Secondary | ICD-10-CM | POA: Diagnosis not present

## 2018-03-20 DIAGNOSIS — N939 Abnormal uterine and vaginal bleeding, unspecified: Secondary | ICD-10-CM | POA: Diagnosis present

## 2018-03-27 ENCOUNTER — Telehealth: Payer: Self-pay

## 2018-03-27 NOTE — Telephone Encounter (Signed)
Called pt to advise of US Results, No Answer & Mailbox is full.

## 2018-03-27 NOTE — Telephone Encounter (Signed)
Patient was returning a call made to her this morning. She is requesting a call back.

## 2018-03-30 NOTE — Telephone Encounter (Signed)
Called pt to inform her of her ultrasound results and that they showed no signs of pregnancy.  [facility]  Pt verbalized understanding.

## 2020-01-05 DIAGNOSIS — O4290 Premature rupture of membranes, unspecified as to length of time between rupture and onset of labor, unspecified weeks of gestation: Secondary | ICD-10-CM

## 2020-01-05 HISTORY — DX: Premature rupture of membranes, unspecified as to length of time between rupture and onset of labor, unspecified weeks of gestation: O42.90

## 2021-02-20 ENCOUNTER — Encounter (HOSPITAL_BASED_OUTPATIENT_CLINIC_OR_DEPARTMENT_OTHER): Payer: Self-pay | Admitting: *Deleted

## 2021-02-20 ENCOUNTER — Emergency Department (HOSPITAL_BASED_OUTPATIENT_CLINIC_OR_DEPARTMENT_OTHER): Payer: Medicaid Other

## 2021-02-20 ENCOUNTER — Emergency Department (HOSPITAL_BASED_OUTPATIENT_CLINIC_OR_DEPARTMENT_OTHER)
Admission: EM | Admit: 2021-02-20 | Discharge: 2021-02-20 | Disposition: A | Discharge status: Home / Self Care | Payer: Medicaid Other | Attending: Emergency Medicine | Admitting: Emergency Medicine

## 2021-02-20 ENCOUNTER — Other Ambulatory Visit: Payer: Self-pay

## 2021-02-20 DIAGNOSIS — R202 Paresthesia of skin: Secondary | ICD-10-CM | POA: Diagnosis not present

## 2021-02-20 DIAGNOSIS — R519 Headache, unspecified: Secondary | ICD-10-CM | POA: Insufficient documentation

## 2021-02-20 DIAGNOSIS — F17210 Nicotine dependence, cigarettes, uncomplicated: Secondary | ICD-10-CM | POA: Insufficient documentation

## 2021-02-20 DIAGNOSIS — Z7982 Long term (current) use of aspirin: Secondary | ICD-10-CM | POA: Diagnosis not present

## 2021-02-20 MED ORDER — KETOROLAC TROMETHAMINE 15 MG/ML IJ SOLN
15.0000 mg | Freq: Once | INTRAMUSCULAR | Status: AC
  Administered 2021-02-20: 15 mg via INTRAMUSCULAR
  Filled 2021-02-20: qty 1

## 2021-02-20 MED ORDER — PROCHLORPERAZINE EDISYLATE 10 MG/2ML IJ SOLN
5.0000 mg | Freq: Once | INTRAMUSCULAR | Status: AC
  Administered 2021-02-20: 5 mg via INTRAMUSCULAR
  Filled 2021-02-20: qty 2

## 2021-02-20 NOTE — ED Provider Notes (Signed)
[facility] EMERGENCY DEPT Provider Note   CSN: [id number] Arrival date & time: 02/20/21  1439     History Chief Complaint  Patient presents with   Headache   Numbness    Anna Harrison is a 30 y.o. female.   Headache Associated symptoms: numbness   Associated symptoms: no abdominal pain and no congestion   Patient presents with headache.  Began prior to arrival.  Headache behind both her eyes.  States she had a blurriness of vision that came initially from the left side and then from the right side coming across in either eye.  Then improved.  Had some numbness in her right hand in the first and third fingers.  Also right lip became numb.  Headache was worse in the left eye.  No nausea or vomiting.  No nausea or vomiting.  Tends to get occasional headaches but not like this.  No trauma.  Denies possibly of pregnancy.  No difficulty walking.    Past Medical History:  Diagnosis Date   Medical history non-contributory     There are no problems to display for this patient.   Past Surgical History:  Procedure Laterality Date   CESAREAN SECTION  2013     OB History     Gravida  4   Para  3   Term  3   Preterm      AB  1   Living  3      SAB  1   IAB      Ectopic      Multiple      Live Births  3           Family History  Problem Relation Age of Onset   Hypertension Mother    COPD Mother    Diabetes Mother    Heart disease Mother    Hypertension Sister     Social History   Tobacco Use   Smoking status: Every Day    Packs/day: 0.50    Types: Cigarettes   Smokeless tobacco: Never  Vaping Use   Vaping Use: Never used  Substance Use Topics   Alcohol use: Yes    Comment: occassionally   Drug use: No    Home Medications Prior to Admission medications   Medication Sig Start Date End Date Taking? Authorizing Provider  acetaminophen (TYLENOL) 500 MG tablet Take 500 mg by mouth every 6 (six) hours as needed for pain.     [provider]  Aspirin-Salicylamide-Caffeine (BC HEADACHE POWDER PO) Take 1 packet by mouth once.    [provider]  HYDROcodone-acetaminophen (NORCO/VICODIN) 5-325 MG tablet Take 1 tablet by mouth every 4 (four) hours as needed. 02/12/16   Burgess Amor, PA-C  oxyCODONE-acetaminophen (PERCOCET/ROXICET) 5-325 MG per tablet Take 1 tablet by mouth every 4 (four) hours as needed for pain. 06/25/12   Zadie Rhine, MD    Allergies    Patient has no known allergies.  Review of Systems   Review of Systems  Constitutional:  Negative for appetite change.  HENT:  Negative for congestion.   Eyes:  Positive for visual disturbance.  Respiratory:  Negative for shortness of breath.   Gastrointestinal:  Negative for abdominal pain.  Genitourinary:  Negative for flank pain.  Skin:  Negative for rash.  Neurological:  Positive for numbness and headaches.  Psychiatric/Behavioral:  Negative for confusion.    Physical Exam Updated Vital Signs BP 136/85 (BP Location: Right Arm)   Pulse 70   Temp  98 F (36.7 C)   Resp 18   Ht 5\' 2"  (1.575 m)   Wt 97.5 kg   SpO2 100%   BMI 39.32 kg/m   Physical Exam Vitals and nursing note reviewed.  Constitutional:      Appearance: She is well-developed.  HENT:     Head: Atraumatic.  Eyes:     General: No visual field deficit.    Extraocular Movements: Extraocular movements intact.     Pupils: Pupils are equal, round, and reactive to light.  Cardiovascular:     Rate and Rhythm: Normal rate and regular rhythm.  Pulmonary:     Breath sounds: Normal breath sounds.  Abdominal:     General: Bowel sounds are normal.  Musculoskeletal:     Cervical back: Neck supple.  Skin:    General: Skin is warm.  Neurological:     Mental Status: She is alert and oriented to person, place, and time.     Cranial Nerves: No cranial nerve deficit or facial asymmetry.     Sensory: No sensory deficit.  Psychiatric:        Mood and Affect: Mood normal.     ED Results / Procedures / Treatments   Labs (all labs ordered are listed, but only abnormal results are displayed) Labs Reviewed - No data to display  EKG None  Radiology CT Head Wo Contrast  Result Date: 02/20/2021 CLINICAL DATA:  Headache. Intracranial hemorrhage suspected. numbness in rt arm, thumb and 1st two fingers, rt sided lip numbness follwed bny headache. Numbness is resolving. Headache remains with pain behind eyes EXAM: CT HEAD WITHOUT CONTRAST TECHNIQUE: Contiguous axial images were obtained from the base of the skull through the vertex without intravenous contrast. COMPARISON:  CT head 02/12/2016 FINDINGS: Brain: No evidence of large-territorial acute infarction. No parenchymal hemorrhage. No mass lesion. No extra-axial collection. No mass effect or midline shift. No hydrocephalus. Basilar cisterns are patent. Partial empty sella. Vascular: No hyperdense vessel. Skull: No acute fracture or focal lesion. Sinuses/Orbits: Paranasal sinuses and mastoid air cells are clear. The orbits are unremarkable. Other: None. IMPRESSION: 1. No acute intracranial abnormality. 2. Partial empty sella. Findings is often a normal anatomic variant but can be associated with idiopathic intracranial hypertension (pseudotumor cerebri). Electronically Signed   By: 04/13/2016 M.D.   On: 02/20/2021 15:59    Procedures Procedures   Medications Ordered in ED Medications  ketorolac (TORADOL) 15 MG/ML injection 15 mg (15 mg Intramuscular Given 02/20/21 1843)  prochlorperazine (COMPAZINE) injection 5 mg (5 mg Intramuscular Given 02/20/21 1844)    ED Course  I have reviewed the triage vital signs and the nursing notes.  Pertinent labs & imaging results that were available during my care of the patient were reviewed by me and considered in my medical decision making (see chart for details).    MDM Rules/Calculators/A&P                           Patient with headache.  Some migrainous type  symptoms such as the visual field abnormalities that came in laterally and then retreated.  Had some tingling or numbness in the hand and face.  Back at baseline.  Still dull headache though.  Head CT showed only partially empty sella.  Feeling somewhat better after migraine treatment.  Do not feel as if we need extensive work-up at this time but will have follow-up with neurology.  Other cause of the vision changes such  as pituitary abnormality or stroke or MS felt less likely.  Discharge home with outpatient follow-up Final Clinical Impression(s) / ED Diagnoses Final diagnoses:  Generalized headache    Rx / DC Orders ED Discharge Orders     None        Benjiman Core, MD 02/20/21 1932

## 2021-02-20 NOTE — ED Triage Notes (Signed)
About 30 minutes PTA numbness in rt arm, thumb and 1st two fingers, rt sided lip numbness follwed bny headache. Numbness is resolving. Headache remains with pain behind eyes

## 2021-02-20 NOTE — ED Notes (Signed)
This RN presented the AVS utilizing Teachback Method. Patient verbalizes understanding of Discharge Instructions. Opportunity for Questioning and Answers were provided. Patient Discharged from ED ambulatory to Home with Family  

## 2021-02-20 NOTE — Discharge Instructions (Addendum)
Schedule a follow-up with one of the neurology groups.  Return for worsening symptoms.  Return for neurologic symptoms that develop independent of the headache also.  Your CAT scan showed a partially empty sella.  Neurology can help follow-up with this.

## 2021-02-21 ENCOUNTER — Encounter: Payer: Self-pay | Admitting: Neurology
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
## 2021-03-19 ENCOUNTER — Ambulatory Visit: Payer: Medicaid Other | Admitting: Psychiatry

## 2021-03-28 ENCOUNTER — Ambulatory Visit: Payer: Medicaid Other | Admitting: Family Medicine

## 2021-03-30 ENCOUNTER — Encounter: Payer: Self-pay | Admitting: Psychiatry

## 2021-03-30 ENCOUNTER — Other Ambulatory Visit: Payer: Self-pay

## 2021-03-30 ENCOUNTER — Ambulatory Visit: Payer: Medicaid Other | Admitting: Psychiatry

## 2021-03-30 VITALS — BP 160/94 | HR 69 | Ht 62.0 in | Wt 236.2 lb

## 2021-03-30 DIAGNOSIS — G43109 Migraine with aura, not intractable, without status migrainosus: Secondary | ICD-10-CM

## 2021-03-30 DIAGNOSIS — G08 Intracranial and intraspinal phlebitis and thrombophlebitis: Secondary | ICD-10-CM

## 2021-03-30 DIAGNOSIS — R519 Headache, unspecified: Secondary | ICD-10-CM

## 2021-03-30 DIAGNOSIS — H538 Other visual disturbances: Secondary | ICD-10-CM

## 2021-03-30 MED ORDER — METHYLPREDNISOLONE 4 MG PO TBPK: ORAL_TABLET | ORAL | 0 refills | Status: DC

## 2021-03-30 NOTE — Patient Instructions (Addendum)
Short course of steroids to help break current headache MRI/MRV Referral to ophthalmology Discuss medication options after testing
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
## 2021-04-02 ENCOUNTER — Telehealth: Payer: Self-pay | Admitting: Psychiatry

## 2021-04-02 MED ORDER — METHYLPREDNISOLONE 4 MG PO TBPK: ORAL_TABLET | ORAL | 0 refills | Status: DC

## 2021-04-02 NOTE — Telephone Encounter (Signed)
Pt called, pharmacy have not received prescription for methylPREDNISolone (MEDROL DOSEPAK) 4 MG TBPK tablet. Would like a call from the nurse

## 2021-04-02 NOTE — Telephone Encounter (Signed)
I re-sent the prescription, looks like it should work this time

## 2021-04-02 NOTE — Telephone Encounter (Signed)
MRI & MRV orders are pending patient's insurance, [employer]. Clinical notes submitted for review.

## 2021-04-02 NOTE — Telephone Encounter (Signed)
Would you like to resend to pharmacy? Looks like E-Prescribing Status: Transmission to pharmacy failed (03/30/2021  9:15 AM EST)

## 2021-04-03 ENCOUNTER — Telehealth: Payer: Self-pay | Admitting: Psychiatry

## 2021-04-03 NOTE — Telephone Encounter (Signed)
Referral faxed to [facility]. Phone # [phone].

## 2021-04-12 NOTE — Telephone Encounter (Signed)
Received approval from [facility]. PA #[id number] (04/02/21- 05/31/21). Orders sent to GI.

## 2021-04-15 ENCOUNTER — Other Ambulatory Visit: Payer: Medicaid Other

## 2021-04-24 ENCOUNTER — Ambulatory Visit: Payer: Medicaid Other | Admitting: Family Medicine

## 2021-05-02 ENCOUNTER — Ambulatory Visit
Admission: RE | Admit: 2021-05-02 | Discharge: 2021-05-02 | Disposition: A | Discharge status: Home / Self Care | Payer: Medicaid Other | Source: Ambulatory Visit | Attending: Psychiatry | Admitting: Psychiatry

## 2021-05-02 DIAGNOSIS — R519 Headache, unspecified: Secondary | ICD-10-CM

## 2021-05-02 DIAGNOSIS — G08 Intracranial and intraspinal phlebitis and thrombophlebitis: Secondary | ICD-10-CM

## 2021-05-02 MED ORDER — GADOBENATE DIMEGLUMINE 529 MG/ML IV SOLN
20.0000 mL | Freq: Once | INTRAVENOUS | Status: AC | PRN
  Administered 2021-05-02: 15:00:00 20 mL via INTRAVENOUS

## 2021-05-09 NOTE — Progress Notes (Signed)
Called patient and left a voicemail regarding her MRI/MRV results.

## 2021-05-24 ENCOUNTER — Ambulatory Visit: Payer: Medicaid Other | Admitting: Neurology

## 2021-07-04 ENCOUNTER — Encounter: Payer: Self-pay | Admitting: Psychiatry

## 2021-07-04 ENCOUNTER — Ambulatory Visit: Payer: Medicaid Other | Admitting: Psychiatry

## 2021-07-04 NOTE — Progress Notes (Deleted)
° °  CC:  headaches  Follow-up Visit  Last visit: 03/30/21  Brief HPI: 31 year old female who follows in clinic for positional headaches.  At her last visit, MRI/MRV was ordered and she was referred to ophthalmology for formal fundus exam.  Interval History: ***Ophtho***  MRI brain was normal. 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   Headache days per month: *** Headache free days per month: *** Headache severity: ***  Current Headache Regimen: Preventative: *** Abortive: ***  # of doses of abortive medications per month: ***  Prior Therapies                                  ***  Physical Exam:   Vital Signs: There were no vitals taken for this visit. GENERAL:  well appearing, in no acute distress, alert  SKIN:  Color, texture, turgor normal. No rashes or lesions HEAD:  Normocephalic/atraumatic. RESP: normal respiratory effort MSK:  No gross joint deformities.   NEUROLOGICAL: Mental Status: Alert, oriented to person, place and time, Follows commands, and Speech fluent and appropriate. Cranial Nerves: PERRL, face symmetric, no dysarthria, hearing grossly intact Motor: moves all extremities equally Gait: normal-based.  IMPRESSION: ***  PLAN: ***   Follow-up: ***  I spent a total of *** minutes on the date of the service. Headache education was done. Discussed lifestyle modification including increased oral hydration, decreased caffeine, exercise and stress management. Discussed treatment options including preventive and acute medications, natural supplements, and infusion therapy. Discussed medication overuse headache and to limit use of acute treatments to no more than 2 days/week or 10 days/month. Discussed medication side effects, adverse reactions and drug interactions. Written educational materials and patient instructions outlining all of the above were given.  Ocie Doyne, MD

## 2021-09-11 ENCOUNTER — Encounter: Payer: Medicaid Other | Admitting: Internal Medicine

## 2021-10-02 ENCOUNTER — Encounter: Payer: Medicaid Other | Admitting: Internal Medicine

## 2021-12-25 ENCOUNTER — Ambulatory Visit: Payer: Medicaid Other | Admitting: Student

## 2021-12-26 ENCOUNTER — Encounter: Payer: Medicaid Other | Admitting: Student

## 2021-12-28 ENCOUNTER — Encounter: Payer: Self-pay | Admitting: Internal Medicine

## 2021-12-28 ENCOUNTER — Ambulatory Visit: Payer: Medicaid Other | Admitting: Internal Medicine

## 2021-12-28 VITALS — BP 157/100 | HR 75 | Temp 98.0°F | Ht 62.0 in | Wt 237.3 lb

## 2021-12-28 DIAGNOSIS — I159 Secondary hypertension, unspecified: Secondary | ICD-10-CM

## 2021-12-28 DIAGNOSIS — D649 Anemia, unspecified: Secondary | ICD-10-CM

## 2021-12-28 DIAGNOSIS — Z6841 Body Mass Index (BMI) 40.0 and over, adult: Secondary | ICD-10-CM | POA: Diagnosis not present

## 2021-12-28 DIAGNOSIS — F17210 Nicotine dependence, cigarettes, uncomplicated: Secondary | ICD-10-CM

## 2021-12-28 DIAGNOSIS — I10 Essential (primary) hypertension: Secondary | ICD-10-CM

## 2021-12-28 DIAGNOSIS — K219 Gastro-esophageal reflux disease without esophagitis: Secondary | ICD-10-CM

## 2021-12-28 LAB — GLUCOSE, CAPILLARY: Glucose-Capillary: 107 mg/dL — ABNORMAL HIGH (ref 70–99)

## 2021-12-28 LAB — POCT GLYCOSYLATED HEMOGLOBIN (HGB A1C): Hemoglobin A1C: 5.5 % (ref 4.0–5.6)

## 2021-12-28 MED ORDER — OLMESARTAN MEDOXOMIL 20 MG PO TABS: 10.0000 mg | ORAL_TABLET | Freq: Every day | ORAL | 1 refills | Status: DC

## 2021-12-28 MED ORDER — OZEMPIC (1 MG/DOSE) 4 MG/3ML ~~LOC~~ SOPN: 0.2500 mg | PEN_INJECTOR | SUBCUTANEOUS | 0 refills | Status: DC

## 2021-12-28 MED ORDER — PANTOPRAZOLE SODIUM 40 MG PO TBEC: 40.0000 mg | DELAYED_RELEASE_TABLET | Freq: Every day | ORAL | 3 refills | Status: DC

## 2021-12-28 MED ORDER — OLMESARTAN MEDOXOMIL 20 MG PO TABS: 10.0000 mg | ORAL_TABLET | Freq: Every day | ORAL | 1 refills | Status: AC

## 2021-12-28 MED ORDER — PANTOPRAZOLE SODIUM 40 MG PO TBEC
40.0000 mg | DELAYED_RELEASE_TABLET | Freq: Every day | ORAL | 3 refills | Status: DC
Start: 2021-12-28 — End: 2021-12-28

## 2021-12-28 NOTE — Assessment & Plan Note (Signed)
The patient has a strong family history of hypertension and has been noted to have elevated blood pressure readings in the past, without a personal diagnosis of hypertension.  Her blood pressure today was initially 154/88 and upon recheck, was 157/100.  The patient denies any headaches, dizziness, vision changes, chest pain, palpitations, or shortness of breath at this time.  In addition to lifestyle modifications, quitting weight loss, we discussed starting an antihypertensive agent at this time and her strong family history of hypertension (and her mother also had a stroke at a young age).  Plan: - BMP today - Olmesartan 10 mg daily - Follow up in ~4 weeks for BP recheck and BMP

## 2021-12-28 NOTE — Progress Notes (Signed)
CC: new patient  HPI:  [name] is a 31 y.o. female living with a history stated below and presents today to establish care with the [facility]. Please see problem based assessment and plan for additional details.  PMHx: none   Meds: none  FDLNMP: every 3 months (last one was 2 months ago)  Social: Has 4 kids, stay at home mom, smokes 5-6 cigarettes/day x 10 years; drinks alcohol 3-4 times per week (few shots); no other illicit substance use  Surg Hx: 3 C sections, tubes tied  Family Hx: HTN, diabetes (mother), mother had a stroke at 64  Prev: last pap during pregnancy (2021)    Past Medical History:  Diagnosis Date   Medical history non-contributory     No current outpatient medications on file prior to visit.   No current facility-administered medications on file prior to visit.    Family History  Problem Relation Age of Onset   Stroke Mother    Hypertension Mother    COPD Mother    Diabetes Mother    Heart disease Mother    Migraines Mother    Hypertension Sister     Social History   Socioeconomic History   Marital status: Single    Spouse name: Not on file   Number of children: Not on file   Years of education: Not on file   Highest education level: Not on file  Occupational History   Not on file  Tobacco Use   Smoking status: Every Day    Packs/day: 0.25    Types: Cigarettes   Smokeless tobacco: Never  Vaping Use   Vaping Use: Never used  Substance and Sexual Activity   Alcohol use: Yes    Comment: occassionally   Drug use: No   Sexual activity: Yes    Birth control/protection: None  Other Topics Concern   Not on file  Social History Narrative   Caffeine 3 sodas daily, Education:  GED, 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    Social Determinants of Health   Financial Resource Strain: Not on file  Food Insecurity: Not on file  Transportation Needs: Not on file  Physical Activity: Not on file  Stress: Not on file  Social Connections: Not on  file  Intimate Partner Violence: Not on file    Review of Systems: ROS negative except for what is noted on the assessment and plan.  Vitals:   12/28/21 1027 12/28/21 1036 12/28/21 1119  BP: (!) 140/103 (!) 154/88 (!) 157/100  Pulse: 87 75 75  Temp: 98 F (36.7 C)    TempSrc: Oral    SpO2: 98%    Weight: 237 lb 4.8 oz (107.6 kg)    Height: 5\' 2"  (1.575 m)      Physical Exam: Constitutional: well-appearing obese female sitting in chair, in no acute distress Cardiovascular: regular rate and rhythm, no m/r/g Pulmonary/Chest: normal work of breathing on room air, lungs clear to auscultation bilaterally Abdominal: soft, non-tender, non-distended MSK: normal bulk and tone Neurological: alert & oriented x 3, no focal deficit Skin: warm and dry Psych: normal mood and behavior  Assessment & Plan:     Patient discussed with Dr.  Hypertension The patient has a strong family history of hypertension and has been noted to have elevated blood pressure readings in the past, without a personal diagnosis of hypertension.  Her blood pressure today was initially 154/88 and upon recheck, was 157/100.  The patient denies any headaches, dizziness, vision changes,  chest pain, palpitations, or shortness of breath at this time.  In addition to lifestyle modifications, quitting weight loss, we discussed starting an antihypertensive agent at this time and her strong family history of hypertension (and her mother also had a stroke at a young age).  Plan: - BMP today - Olmesartan 10 mg daily - Follow up in ~4 weeks for BP recheck and BMP  BMI 40.0-44.9, adult (HCC) Today, the patient weighs 237 pounds and her BMI is high at 43.4.  The patient is interested in starting a medication to assist her on her weight loss journey and she is specifically inquiring about Ozempic, as her mother takes this for weight loss and diabetes.  The patient has 4 young children at home and does not often have time to  exercise, but she does state that she watches her diet.  We discussed that Medicaid may not cover Ozempic, given that her A1c is 5.5% and she does not have a diagnosis of diabetes, but we will attempt this.  If the medication does not get covered, we discussed a referral to the weight loss clinic.  Patient is in agreement with the plan.   Plan: - Ozempic 0.25 mg/week - Weight loss clinic referral in the future - A1c and lipid panel today  GERD (gastroesophageal reflux disease) The patient has a history of acid reflux and has been dealing with this for the past 8 or so years.  She has taken over-the-counter medications (TUMS), with no relief.  She states that she gets a lot of heartburn in her chest, especially when she lays down at night.  She also has nausea during these episodes, with occasional vomiting.  She has not been able to identify any specific food triggers for her symptoms.  The patient also states that over the last few years, she has had difficulty with swallowing solid foods.  She has no difficulty with swallowing liquids, but describes that with meats especially, she feels that she is choking while swallowing. The patient chews her food thoroughly, but still has these troubles. She denies any episodes of choking/aspiration, but this is quite bothersome to her.    Plan: - Protonix 40 mg daily - Referral to GI (may need EGD to rule out stricture or EOE)   Denay Pleitez, D.O. [facility] Internal Medicine, PGY-2 Phone: [phone] Date 12/28/2021 Time 12:19 PM

## 2021-12-28 NOTE — Assessment & Plan Note (Signed)
The patient has a history of acid reflux and has been dealing with this for the past 8 or so years.  She has taken over-the-counter medications (TUMS), with no relief.  She states that she gets a lot of heartburn in her chest, especially when she lays down at night.  She also has nausea during these episodes, with occasional vomiting.  She has not been able to identify any specific food triggers for her symptoms.  The patient also states that over the last few years, she has had difficulty with swallowing solid foods.  She has no difficulty with swallowing liquids, but describes that with meats especially, she feels that she is choking while swallowing. The patient chews her food thoroughly, but still has these troubles. She denies any episodes of choking/aspiration, but this is quite bothersome to her.    Plan: - Protonix 40 mg daily - Referral to GI (may need EGD to rule out stricture or EOE)

## 2021-12-28 NOTE — Assessment & Plan Note (Addendum)
Today, the patient weighs 237 pounds and her BMI is high at 43.4.  The patient is interested in starting a medication to assist her on her weight loss journey and she is specifically inquiring about Ozempic, as her mother takes this for weight loss and diabetes.  The patient has 4 young children at home and does not often have time to exercise, but she does state that she watches her diet.  We discussed that Medicaid may not cover Ozempic, given that her A1c is 5.5% and she does not have a diagnosis of diabetes, but we will attempt this.  If the medication does not get covered, we discussed a referral to the weight loss clinic.  Patient is in agreement with the plan.   Plan: - Ozempic 0.25 mg/week - Weight loss clinic referral in the future - A1c and lipid panel today  Addendum 8/24: - Ozempic and Wegovy denied by insurance; placed referral to weight loss clinic at this time

## 2021-12-28 NOTE — Patient Instructions (Signed)
Thank you, Ms.Elleanna Melling for allowing [place] to provide your care today. Today we discussed:  High blood pressure: Start taking olmesartan 10 mg daily (half a tablet).   Weight loss: I have sent in a prescription for Ozempic, and we will have to wait to see if your insurance covers this. We are checking some labs today too, to see if you have diabetes We can send you to the weight loss clinic if it doesn't get approved  Acid reflux/choking: Take protonix 40 mg once a day before your largest meal. I am also referring you to the gastroenterologist  I have ordered the following labs for you:   Lab Orders         BMP8+Anion Gap         Lipid Profile         CBC no Diff         POC Hbg A1C       Referrals ordered today:    Referral Orders         Ambulatory referral to Gastroenterology      I have ordered the following medication/changed the following medications:   Stop the following medications: Medications Discontinued During This Encounter  Medication Reason   methylPREDNISolone (MEDROL DOSEPAK) 4 MG TBPK tablet    acetaminophen (TYLENOL) 500 MG tablet      Start the following medications: Meds ordered this encounter  Medications   pantoprazole (PROTONIX) 40 MG tablet    Sig: Take 1 tablet (40 mg total) by mouth daily.    Dispense:  30 tablet    Refill:  3   Semaglutide, 1 MG/DOSE, (OZEMPIC, 1 MG/DOSE,) 4 MG/3ML SOPN    Sig: Inject 0.25 mg into the skin once a week.    Dispense:  3 mL    Refill:  0   olmesartan (BENICAR) 20 MG tablet    Sig: Take 0.5 tablets (10 mg total) by mouth daily.    Dispense:  45 tablet    Refill:  1     Follow up:  1 month for BP      Should you have any questions or concerns please call the internal medicine clinic at [phone].     Elza Rafter, D.O. [facility]

## 2021-12-29 LAB — LIPID PANEL
Chol/HDL Ratio: 6.3 ratio — ABNORMAL HIGH (ref 0.0–4.4)
Cholesterol, Total: 219 mg/dL — ABNORMAL HIGH (ref 100–199)
HDL: 35 mg/dL — ABNORMAL LOW (ref >39)
LDL Chol Calc (NIH): 149 mg/dL — ABNORMAL HIGH (ref 0–99)
Triglycerides: 193 mg/dL — ABNORMAL HIGH (ref 0–149)
VLDL Cholesterol Cal: 35 mg/dL (ref 5–40)

## 2021-12-29 LAB — CBC
Hematocrit: 41.4 % (ref 34.0–46.6)
Hemoglobin: 14.2 g/dL (ref 11.1–15.9)
MCH: 34.5 pg — ABNORMAL HIGH (ref 26.6–33.0)
MCHC: 34.3 g/dL (ref 31.5–35.7)
MCV: 101 fL — ABNORMAL HIGH (ref 79–97)
Platelets: 349 10*3/uL (ref 150–450)
RBC: 4.12 x10E6/uL (ref 3.77–5.28)
RDW: 13.1 % (ref 11.7–15.4)
WBC: 10.9 10*3/uL — ABNORMAL HIGH (ref 3.4–10.8)

## 2021-12-29 LAB — BMP8+ANION GAP
Anion Gap: 14 mmol/L (ref 10.0–18.0)
BUN/Creatinine Ratio: 15 (ref 9–23)
BUN: 8 mg/dL (ref 6–20)
CO2: 20 mmol/L (ref 20–29)
Calcium: 9 mg/dL (ref 8.7–10.2)
Chloride: 103 mmol/L (ref 96–106)
Creatinine, Ser: 0.52 mg/dL — ABNORMAL LOW (ref 0.57–1.00)
Glucose: 106 mg/dL — ABNORMAL HIGH (ref 70–99)
Potassium: 4.4 mmol/L (ref 3.5–5.2)
Sodium: 137 mmol/L (ref 134–144)
eGFR: 127 mL/min/{1.73_m2} (ref >59)

## 2021-12-31 ENCOUNTER — Telehealth: Payer: Self-pay | Admitting: Internal Medicine

## 2021-12-31 NOTE — Progress Notes (Signed)
Internal Medicine Clinic Attending ° °Case discussed with Dr. Atway  At the time of the visit.  We reviewed the resident’s history and exam and pertinent patient test results.  I agree with the assessment, diagnosis, and plan of care documented in the resident’s note.  °

## 2021-12-31 NOTE — Telephone Encounter (Signed)
Pt seen 08/18//2023.  Pt unable to get the following medication.  Per pt her pharmacy states the med will need a Prior Authorization done.

## 2021-12-31 NOTE — Progress Notes (Signed)
Cr and electrolytes within normal limits. Total cholesterol and LDL are high, however, unable to calculate ASCVD risk given patient's age. Counseled the patient on dietary modifications and exercise, and that we will continue to watch her cholesterol.   Hb within normal limits on CBC.

## 2021-12-31 NOTE — Telephone Encounter (Signed)
Called pt - stated PA needed for Ozempic.

## 2022-01-01 ENCOUNTER — Telehealth: Payer: Self-pay

## 2022-01-01 NOTE — Telephone Encounter (Signed)
PA for patient (ozempic) came through on cover my meds, was submitted with last office notes awaiting approval or denial

## 2022-01-02 ENCOUNTER — Encounter: Payer: Self-pay | Admitting: Internal Medicine

## 2022-01-02 ENCOUNTER — Other Ambulatory Visit: Payer: Self-pay | Admitting: Internal Medicine

## 2022-01-02 ENCOUNTER — Telehealth: Payer: Self-pay | Admitting: Internal Medicine

## 2022-01-02 MED ORDER — WEGOVY 0.25 MG/0.5ML ~~LOC~~ SOAJ: 0.2500 mg | SUBCUTANEOUS | 0 refills | Status: DC

## 2022-01-02 NOTE — Telephone Encounter (Signed)
Pt calling back to see what other medication she can take since her Prior Authorization was denied per her Ins for the following medication:  Semaglutide, 1 MG/DOSE, (OZEMPIC, 1 MG/DOSE,) 4 MG/3ML SOPN  [id number] Lorenza Evangelist, [place] - [address]

## 2022-01-02 NOTE — Telephone Encounter (Signed)
PA for ozempic denied  

## 2022-01-02 NOTE — Addendum Note (Signed)
Addended by: Elza Rafter on: 01/02/2022 04:51 PM   Modules accepted: Orders

## 2022-01-03 ENCOUNTER — Other Ambulatory Visit: Payer: Self-pay | Admitting: Internal Medicine

## 2022-01-03 DIAGNOSIS — Z6841 Body Mass Index (BMI) 40.0 and over, adult: Secondary | ICD-10-CM

## 2022-01-29 ENCOUNTER — Encounter: Payer: Self-pay | Admitting: Internal Medicine

## 2022-04-17 ENCOUNTER — Encounter: Payer: Self-pay | Admitting: Internal Medicine

## 2022-07-02 ENCOUNTER — Other Ambulatory Visit: Payer: Self-pay | Admitting: Internal Medicine

## 2022-07-02 DIAGNOSIS — K219 Gastro-esophageal reflux disease without esophagitis: Secondary | ICD-10-CM

## 2022-07-16 ENCOUNTER — Encounter: Payer: Medicaid Other | Admitting: Nurse Practitioner

## 2022-08-05 NOTE — Progress Notes (Deleted)
   CC: leg pain  HPI:  Anna Harrison is a 32 y.o. female living with a history stated below and presents today for an acute visit regarding leg pain. Please see problem based assessment and plan for additional details.  Past Medical History:  Diagnosis Date   Medical history non-contributory     Current Outpatient Medications on File Prior to Visit  Medication Sig Dispense Refill   olmesartan (BENICAR) 20 MG tablet Take 0.5 tablets (10 mg total) by mouth daily. 45 tablet 1   pantoprazole (PROTONIX) 40 MG tablet TAKE 1 TABLET BY MOUTH EVERY DAY 90 tablet 1   Semaglutide-Weight Management (WEGOVY) 0.25 MG/0.5ML SOAJ Inject 0.25 mg into the skin once a week. 2 mL 0   No current facility-administered medications on file prior to visit.    Family History  Problem Relation Age of Onset   Stroke Mother    Hypertension Mother    COPD Mother    Diabetes Mother    Heart disease Mother    Migraines Mother    Hypertension Sister     Social History   Socioeconomic History   Marital status: Single    Spouse name: Not on file   Number of children: Not on file   Years of education: Not on file   Highest education level: Not on file  Occupational History   Not on file  Tobacco Use   Smoking status: Every Day    Packs/day: .25    Types: Cigarettes   Smokeless tobacco: Never  Vaping Use   Vaping Use: Never used  Substance and Sexual Activity   Alcohol use: Yes    Comment: occassionally   Drug use: No   Sexual activity: Yes    Birth control/protection: None  Other Topics Concern   Not on file  Social History Narrative   Caffeine 3 sodas daily, Education:  GED, 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    Social Determinants of Health   Financial Resource Strain: Not on file  Food Insecurity: Not on file  Transportation Needs: Not on file  Physical Activity: Not on file  Stress: Not on file  Social Connections: Not on file  Intimate Partner Violence: Not on file    Review of  Systems: ROS negative except for what is noted on the assessment and plan.  There were no vitals filed for this visit.  Physical Exam: Constitutional: well-appearing *** sitting in ***, in no acute distress HENT: normocephalic atraumatic, mucous membranes moist Eyes: conjunctiva non-erythematous Cardiovascular: regular rate and rhythm, no m/r/g Pulmonary/Chest: normal work of breathing on room air, lungs clear to auscultation bilaterally Abdominal: soft, non-tender, non-distended MSK: normal bulk and tone Neurological: alert & oriented x 3, no focal deficit Skin: warm and dry Psych: normal mood and behavior  Assessment & Plan:   Leg pain: given tramadol and robaxin x  1 dose in [facility] ED - right hip and right leg pain x 1 month, no redness or swelling, no injury  Patient {GC/GE:[id number]::"discussed with","seen with"} Dr. OF:[id number]. Hoffman","Mullen","Narendra","Vincent","Guilloud","Lau","Machen"}  No problem-specific Assessment & Plan notes found for this encounter.   Buddy Duty, D.O. [facility] Internal Medicine, PGY-2 Phone: [phone] Date 08/05/2022 Time 2:51 PM

## 2022-08-06 ENCOUNTER — Encounter: Payer: Medicaid Other | Admitting: Internal Medicine

## 2022-10-10 DIAGNOSIS — M47816 Spondylosis without myelopathy or radiculopathy, lumbar region: Secondary | ICD-10-CM | POA: Insufficient documentation

## 2022-10-10 DIAGNOSIS — M5431 Sciatica, right side: Secondary | ICD-10-CM

## 2022-10-10 DIAGNOSIS — M545 Low back pain, unspecified: Secondary | ICD-10-CM | POA: Insufficient documentation

## 2022-10-10 HISTORY — DX: Spondylosis without myelopathy or radiculopathy, lumbar region: M47.816

## 2022-10-10 HISTORY — DX: Sciatica, right side: M54.31

## 2022-10-10 HISTORY — DX: Low back pain, unspecified: M54.50

## 2022-10-14 ENCOUNTER — Ambulatory Visit (HOSPITAL_COMMUNITY): Payer: Medicaid Other

## 2022-11-03 DIAGNOSIS — M51369 Other intervertebral disc degeneration, lumbar region without mention of lumbar back pain or lower extremity pain: Secondary | ICD-10-CM | POA: Insufficient documentation

## 2022-11-03 HISTORY — DX: Other intervertebral disc degeneration, lumbar region without mention of lumbar back pain or lower extremity pain: M51.369

## 2022-11-04 DIAGNOSIS — N3000 Acute cystitis without hematuria: Secondary | ICD-10-CM

## 2022-11-04 HISTORY — DX: Acute cystitis without hematuria: N30.00

## 2022-11-11 ENCOUNTER — Telehealth: Payer: Self-pay

## 2022-11-11 NOTE — Transitions of Care (Post Inpatient/ED Visit) (Signed)
   11/11/2022  Name: Anna Harrison MRN: [id number] DOB: [birth year]  Today's TOC FU Call Status: Today's TOC FU Call Status:: Successful TOC FU Call Competed TOC FU Call Complete Date: 11/11/22  Transition Care Management Follow-up Telephone Call Date of Discharge: 11/07/22 Discharge Facility: Other ([facility]) Name of Other ([facility]) Discharge Facility: [facility] Type of Discharge: Inpatient Admission Primary Inpatient Discharge Diagnosis:: cystitis How have you been since you were released from the hospital?: Better Any questions or concerns?: No  Items Reviewed: Did you receive and understand the discharge instructions provided?: Yes Medications obtained,verified, and reconciled?: Yes (Medications Reviewed) Any new allergies since your discharge?: No Dietary orders reviewed?: NA Do you have support at home?: Yes People in Home: parent(s)  Medications Reviewed Today: Medications Reviewed Today     Reviewed by Karena Addison, LPN (Licensed Practical Nurse) on 11/11/22 at 1016  Med List Status: <None>   Medication Order Taking? Sig Documenting Provider Last Dose Status Informant  olmesartan (BENICAR) 20 MG tablet [id number]  Take 0.5 tablets (10 mg total) by mouth daily. Atway, Rayann N, DO  Expired 06/26/22 2359   pantoprazole (PROTONIX) 40 MG tablet [id number]  TAKE 1 TABLET BY MOUTH EVERY DAY Atway, Rayann N, DO  Active   Semaglutide-Weight Management (WEGOVY) 0.25 MG/0.5ML SOAJ [id number]  Inject 0.25 mg into the skin once a week. Chauncey Mann, DO  Active             Home Care and Equipment/Supplies: Were Home Health Services Ordered?: NA Any new equipment or medical supplies ordered?: NA  Functional Questionnaire: Do you need assistance with bathing/showering or dressing?: No Do you need assistance with meal preparation?: No Do you need assistance with eating?: No Do you have difficulty maintaining continence: No Do you need assistance with getting  out of bed/getting out of a chair/moving?: No Do you have difficulty managing or taking your medications?: No  Follow up appointments reviewed: PCP Follow-up appointment confirmed?: No (declined) MD Provider Line Number:[phone] Given: No Specialist Hospital Follow-up appointment confirmed?: Yes Date of Specialist follow-up appointment?: 11/21/22 Follow-Up Specialty Provider:: Pain Clinic Do you need transportation to your follow-up appointment?: No Do you understand care options if your condition(s) worsen?: Yes-patient verbalized understanding    SIGNATURE Karena Addison, LPN [facility] Nurse Health Advisor Direct Dial [phone]

## 2022-12-26 ENCOUNTER — Other Ambulatory Visit: Payer: Self-pay | Admitting: Internal Medicine

## 2022-12-26 DIAGNOSIS — K219 Gastro-esophageal reflux disease without esophagitis: Secondary | ICD-10-CM

## 2022-12-26 IMAGING — CT CT HEAD W/O CM
4 series · 17 of 47 positions shown, 19 images · non-contrast
Comparison: CT head 02/12/2016

CLINICAL DATA: Headache. Intracranial hemorrhage suspected.
numbness in rt arm, thumb and 1st two fingers, rt sided lip numbness
Marivi Jewett headache. Numbness is resolving. Headache remains with
pain behind eyes

EXAM:
CT HEAD WITHOUT CONTRAST
TECHNIQUE: Contiguous axial images were obtained from the base of the skull
through the vertex without intravenous contrast.

[Series 2: head wo · axial · 0.44mm/px · z∈[+953,+1073]mm · 7 of 32 slices shown, 9 images]
[im 4/32  brain]
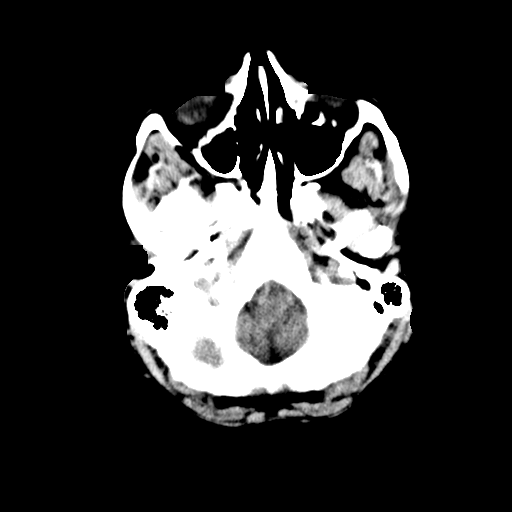
[im 4/32  bone]
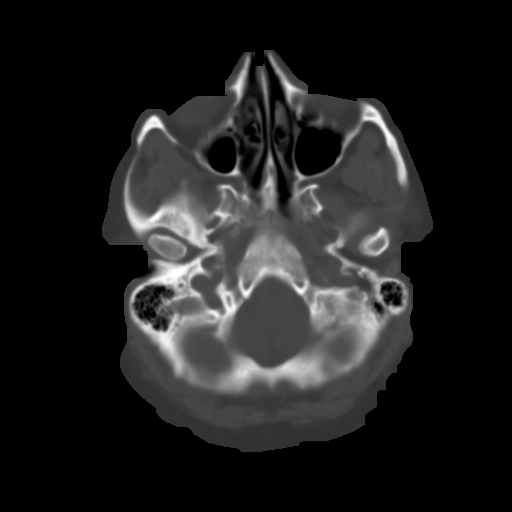
[im 8/32  brain]
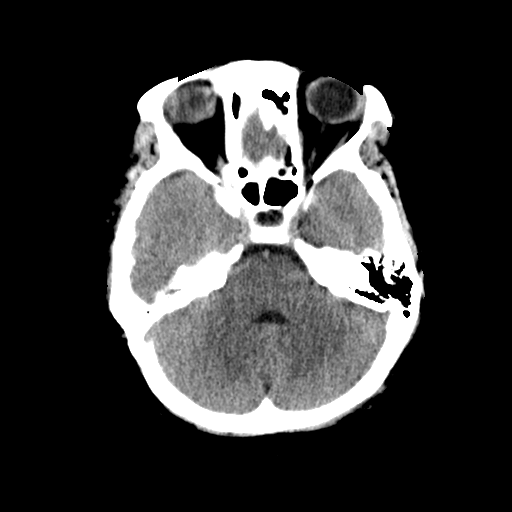
[im 12/32  brain]
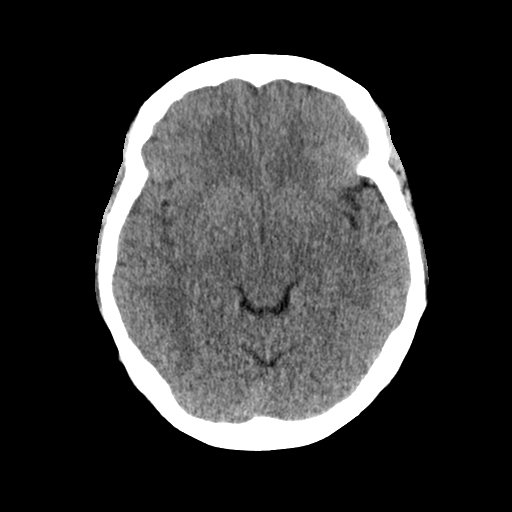
[im 16/32  brain]
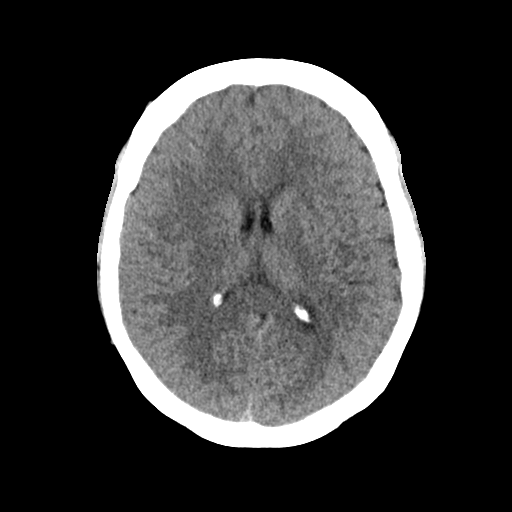
[im 20/32  brain]
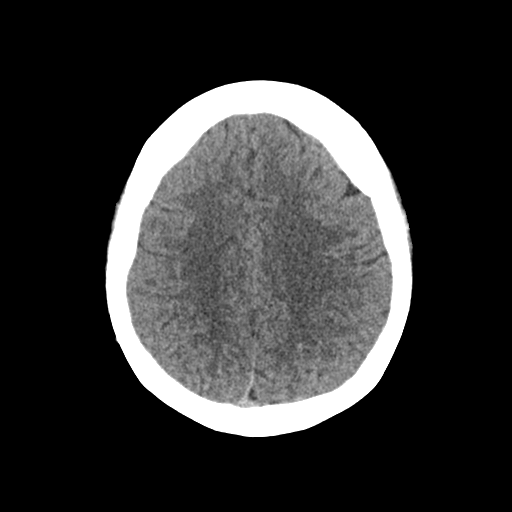
[im 20/32  bone]
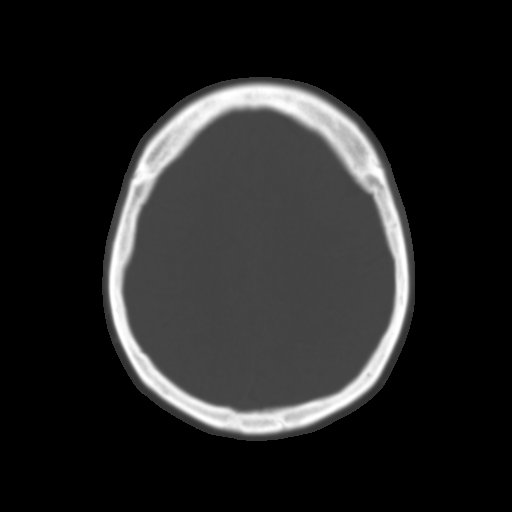
[im 24/32  brain]
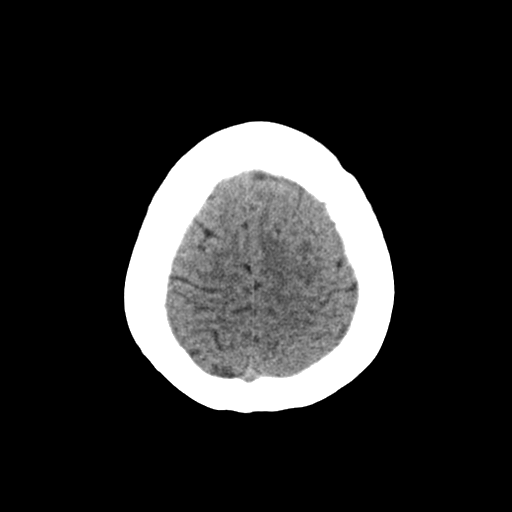
[im 28/32  brain]
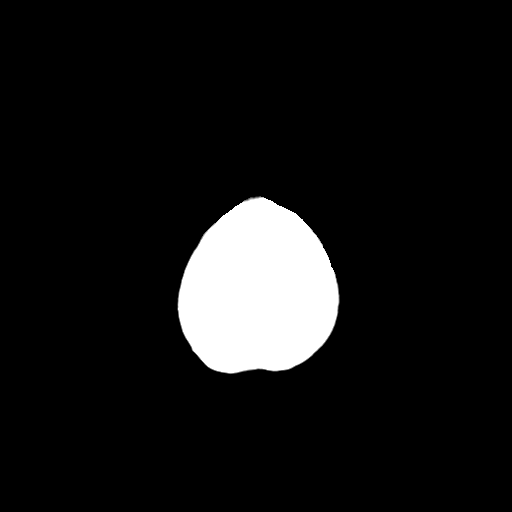

[Series 3: head bone · axial · 0.44mm/px · z∈[+952,+1008]mm · 4 of 79 slices shown]
[im 8/79  bone]
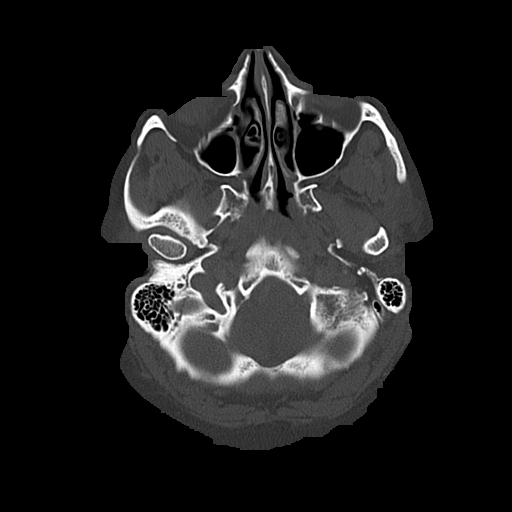
[im 16/79  bone]
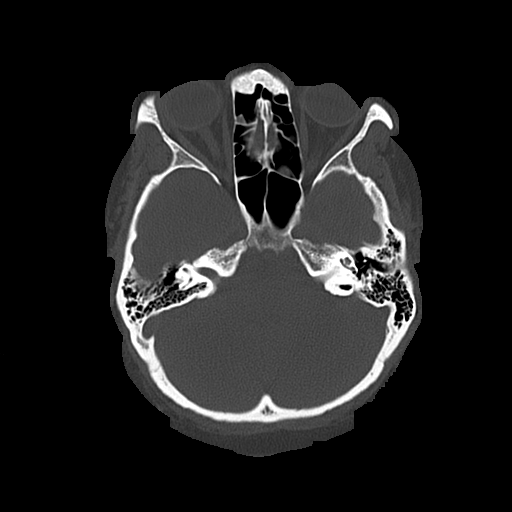
[im 24/79  bone]
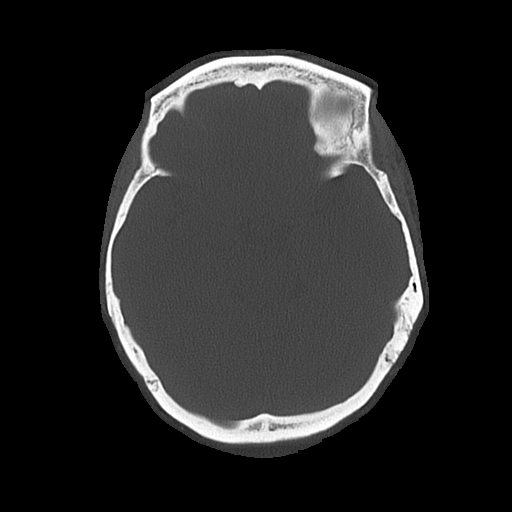
[im 36/79  bone]
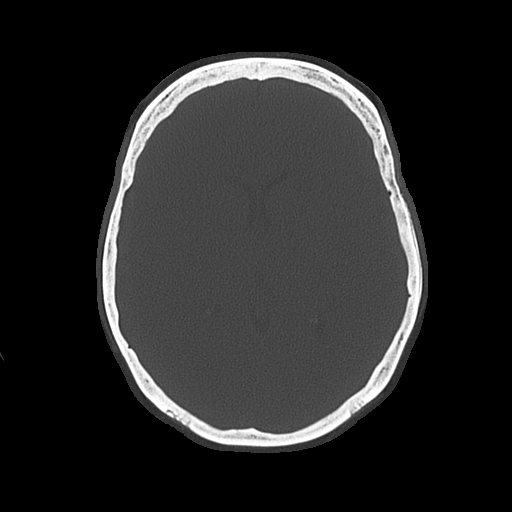

[Series 4: coronal soft · coronal · 0.33mm/px · 3 of 68 slices shown]
[im 23/68  brain]
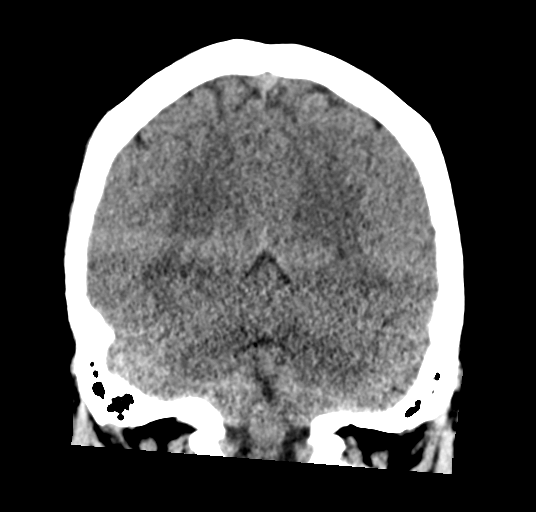
[im 30/68  brain]
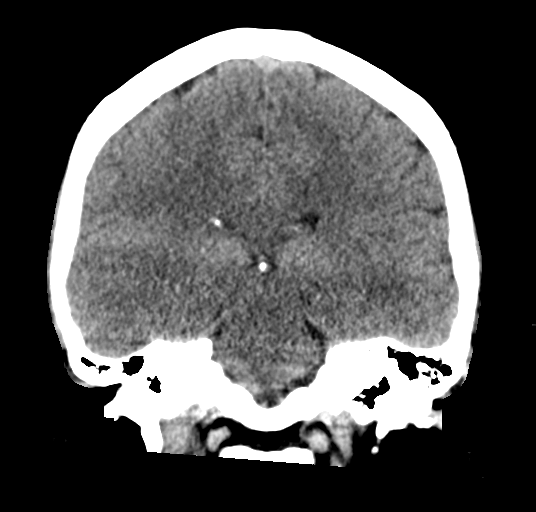
[im 38/68  brain]
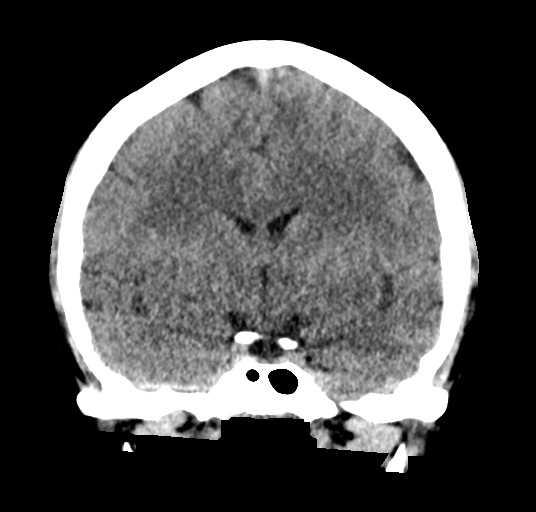

[Series 5: sagittal soft · sagittal · 0.34mm/px · 3 of 60 slices shown]
[im 20/60  brain]
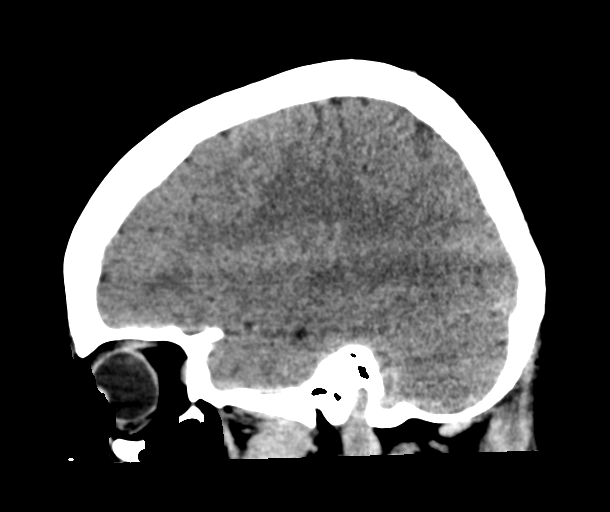
[im 30/60  brain]
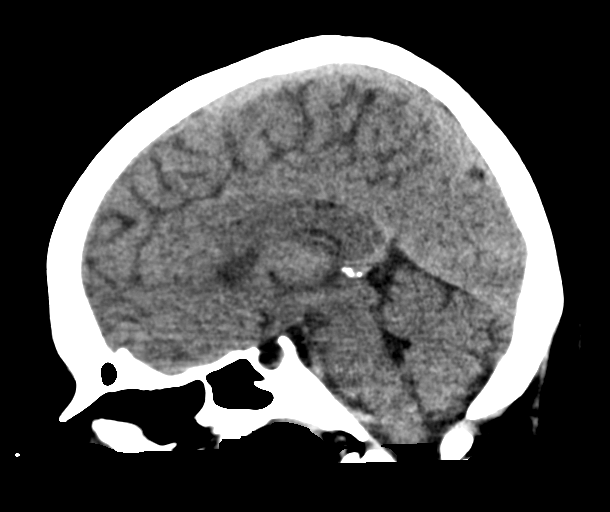
[im 40/60  brain]
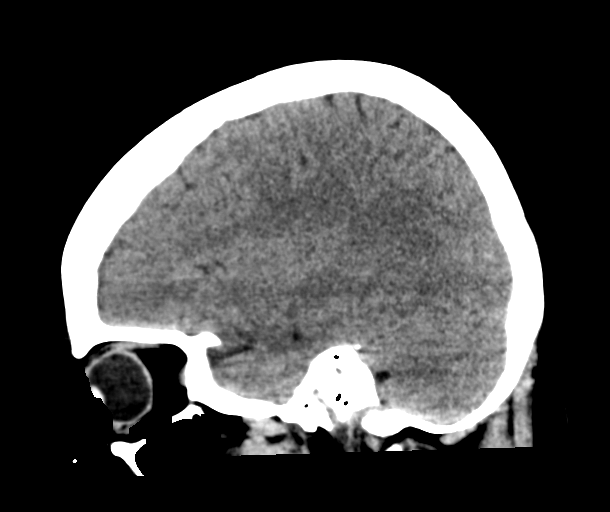

[17 of 47 positions shown; findings below may reference images not displayed]

FINDINGS: Brain:

No evidence of large-territorial acute infarction. No parenchymal
hemorrhage. No mass lesion. No extra-axial collection.

No mass effect or midline shift. No hydrocephalus. Basilar cisterns
are patent.

Partial empty sella.

Vascular: No hyperdense vessel.

Skull: No acute fracture or focal lesion.

Sinuses/Orbits: Paranasal sinuses and mastoid air cells are clear.
The orbits are unremarkable.

Other: None.
IMPRESSION: 1. No acute intracranial abnormality.
2. Partial empty sella. Findings is often a normal anatomic variant
but can be associated with idiopathic intracranial hypertension
(pseudotumor cerebri).

## 2022-12-27 NOTE — Telephone Encounter (Signed)
Call to patient to see if she is going to stay an [facility] patient. Refill on Pantoprazole was done.  Has an upcoming appointment with [facility] on 02/11/2023.  Message left for patient to contact the Clinics.

## 2023-01-10 ENCOUNTER — Other Ambulatory Visit: Payer: Self-pay | Admitting: Internal Medicine

## 2023-01-10 DIAGNOSIS — K219 Gastro-esophageal reflux disease without esophagitis: Secondary | ICD-10-CM

## 2023-02-11 ENCOUNTER — Ambulatory Visit: Payer: Medicaid Other | Admitting: Family Medicine

## 2023-03-20 ENCOUNTER — Ambulatory Visit: Payer: Medicaid Other | Admitting: Physician Assistant

## 2023-04-08 ENCOUNTER — Other Ambulatory Visit: Payer: Self-pay | Admitting: Internal Medicine

## 2023-04-08 DIAGNOSIS — K219 Gastro-esophageal reflux disease without esophagitis: Secondary | ICD-10-CM

## 2023-07-10 ENCOUNTER — Other Ambulatory Visit: Payer: Self-pay | Admitting: Internal Medicine

## 2023-07-10 DIAGNOSIS — K219 Gastro-esophageal reflux disease without esophagitis: Secondary | ICD-10-CM

## 2023-07-17 NOTE — Telephone Encounter (Signed)
 Patient sent my chart message asking to contact our office to schecule

## 2023-08-03 ENCOUNTER — Other Ambulatory Visit: Payer: Self-pay | Admitting: Internal Medicine

## 2023-08-03 DIAGNOSIS — K219 Gastro-esophageal reflux disease without esophagitis: Secondary | ICD-10-CM

## 2023-10-03 ENCOUNTER — Other Ambulatory Visit: Payer: Self-pay | Admitting: Internal Medicine

## 2023-10-03 DIAGNOSIS — K219 Gastro-esophageal reflux disease without esophagitis: Secondary | ICD-10-CM

## 2023-10-03 NOTE — Telephone Encounter (Signed)
 Patient last seen 12/28/2021. The pharmacy is requesting a 90 day supply.

## 2023-10-13 ENCOUNTER — Telehealth: Payer: Self-pay

## 2023-10-13 ENCOUNTER — Encounter: Payer: Self-pay | Admitting: Internal Medicine

## 2023-10-13 ENCOUNTER — Ambulatory Visit: Payer: Self-pay | Admitting: Internal Medicine

## 2023-10-13 VITALS — BP 161/79 | HR 57 | Temp 98.7°F | Ht 62.0 in | Wt 225.2 lb

## 2023-10-13 DIAGNOSIS — I10 Essential (primary) hypertension: Secondary | ICD-10-CM | POA: Diagnosis not present

## 2023-10-13 DIAGNOSIS — F410 Panic disorder [episodic paroxysmal anxiety] without agoraphobia: Secondary | ICD-10-CM | POA: Diagnosis present

## 2023-10-13 DIAGNOSIS — Z6841 Body Mass Index (BMI) 40.0 and over, adult: Secondary | ICD-10-CM

## 2023-10-13 DIAGNOSIS — R42 Dizziness and giddiness: Secondary | ICD-10-CM | POA: Insufficient documentation

## 2023-10-13 MED ORDER — HYDROXYZINE HCL 25 MG PO TABS
25.0000 mg | ORAL_TABLET | Freq: Three times a day (TID) | ORAL | 0 refills | Status: DC | PRN
Start: 2023-10-13 — End: 2023-12-08

## 2023-10-13 MED ORDER — ESCITALOPRAM OXALATE 5 MG PO TABS: 5.0000 mg | ORAL_TABLET | Freq: Every day | ORAL | 3 refills | Status: DC

## 2023-10-13 MED ORDER — SEMAGLUTIDE-WEIGHT MANAGEMENT 1.7 MG/0.75ML ~~LOC~~ SOAJ: 1.7000 mg | SUBCUTANEOUS | 0 refills | Status: AC

## 2023-10-13 MED ORDER — SEMAGLUTIDE-WEIGHT MANAGEMENT 0.5 MG/0.5ML ~~LOC~~ SOAJ: 0.5000 mg | SUBCUTANEOUS | 0 refills | Status: AC

## 2023-10-13 MED ORDER — SEMAGLUTIDE-WEIGHT MANAGEMENT 2.4 MG/0.75ML ~~LOC~~ SOAJ
2.4000 mg | SUBCUTANEOUS | 0 refills | Status: DC
Start: 2024-02-06 — End: 2024-03-04

## 2023-10-13 MED ORDER — SEMAGLUTIDE-WEIGHT MANAGEMENT 0.25 MG/0.5ML ~~LOC~~ SOAJ: 0.2500 mg | SUBCUTANEOUS | 0 refills | Status: AC

## 2023-10-13 MED ORDER — SEMAGLUTIDE-WEIGHT MANAGEMENT 1 MG/0.5ML ~~LOC~~ SOAJ: 1.0000 mg | SUBCUTANEOUS | 0 refills | Status: AC

## 2023-10-13 NOTE — Assessment & Plan Note (Signed)
 Presents with 6 month history of 2-3 panic attacks daily. Denies suicidal or homicidal ideation. GAD elevated at 17. She is open to therapy and medications.  A/P: I want to rule out hyperthyroidism or anemia contributing to presentation so will get TSH and CBC today.  -referral to Behavioral Health placed -start lexapro 5 mg, talked with her that it would take a few weeks before she may note improvement, follow-up in 4 weeks with plans to increase medication if GAD score doesn't improve or continues to have multiple panic attacks daily -hydroxyzine 25 mg TID PRN for panic attack or help with sleep

## 2023-10-13 NOTE — Telephone Encounter (Signed)
 Kalley Nicholl (Key: [id number]) Rx #: [id number] Wegovy  0.25MG /0.5ML auto-injectors Form [facility] [employer] [place]  Medicaid Electronic PA Form (2017 NCPDP) Created Sent to Plan Plan Response Submit Clinical Questions Determination Favorable Your prior authorization for Wegovy  has been approved! More Info Personalized support and financial assistance may be available through the [employer] program. For more information, and to see program requirements, click on the More Info button to the right.  Message from plan: PA Case: [id number], Status: Approved, Coverage Starts on: 10/13/2023 12:00:00 AM, Coverage Ends on: 04/10/2024 12:00:00 AM.. Authorization Expiration Date: April 10, 2024.  I called the patient x2 unable to reach her, I lvm regarding the approval.

## 2023-10-13 NOTE — Assessment & Plan Note (Signed)
 She remains interested in medications for help with weight loss. She has lost 12 lbs since last visit with keto diet and increasing walking. P: Wegovy  sent, will need to see if Prior auth approved She is open to weight management clinic if this is not approved.

## 2023-10-13 NOTE — Progress Notes (Signed)
 Subjective:  CC: panic attacks  HPI:  [name] is a 33 y.o. female with a past medical history of right lumbosacral radiculopathy s/p L5/S1 microdiscectomy, GERD and HTN who presents today for 6 month history of panic attacks.  She has never had these in the past. She does not feel like her life circumstances have changed. She lives with her 4 children and is working full time at a call center for a [employer].  The panic attacks are happening 2-3 times a day. She has had to leave work several times due to episodes. Also endorses difficulty with falling and staying asleep. She is having trouble leaving the house due to fear that she will have panic attack in public. Episodes consistent of elevated heart rate, sense of doom and sweating. She tries to go to private area during episode and looked up some breathing techniques that help a bit.    She also expressed concern about her weight. She lost about 12lbs since last visit 1.5 years ago with keto diet and increasing walking but is frustrated at progress. She was last seen in [facility] 12/2021 and was started on IM semaglutide , however was not able to obtain medication due to insurance and was referred to weight loss clinic.  Please see problem based assessment and plan for additional details.  Past Medical History:  Diagnosis Date   Acute cystitis without hematuria 11/04/2022   Amniotic fluid leaking 01/05/2020   Anemia during pregnancy in third trimester 03/24/2017   Arthropathy of lumbar facet joint 10/10/2022   DDD (degenerative disc disease), lumbar 11/03/2022   Dentalgia 12/17/2017   Low back pain 10/10/2022   Medical history non-contributory    Pregnancy complicated by tobacco use in first trimester 03/24/2017   Previous cesarean delivery affecting pregnancy, antepartum 03/24/2017   Right sided sciatica 10/10/2022   Small for gestational age fetus affecting management of mother, third trimester, single gestation  04/07/2017    MEDICATIONS:  Olmesartan  20 mg Pantoprazole  40 mg  Family History  Problem Relation Age of Onset   Stroke Mother    Hypertension Mother    COPD Mother    Diabetes Mother    Heart disease Mother    Migraines Mother    Hypertension Sister     Past Surgical History:  Procedure Laterality Date   CESAREAN SECTION  2013   x 3     Social History   Socioeconomic History   Marital status: Single    Spouse name: Not on file   Number of children: Not on file   Years of education: Not on file   Highest education level: Not on file  Occupational History   Not on file  Tobacco Use   Smoking status: Every Day    Current packs/day: 0.50    Types: Cigarettes   Smokeless tobacco: Never  Vaping Use   Vaping status: Never Used  Substance and Sexual Activity   Alcohol use: Yes    Comment: occassionally   Drug use: No   Sexual activity: Yes    Birth control/protection: None  Other Topics Concern   Not on file  Social History Narrative   Caffeine 3 sodas daily, Education:  GED, 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    Social Drivers of Health   Financial Resource Strain: Low Risk  (10/13/2023)   Overall Financial Resource Strain (CARDIA)    Difficulty of Paying Living Expenses: Not very hard  Food Insecurity: No Food Insecurity (10/13/2023)  Hunger Vital Sign    Worried About Running Out of Food in the Last Year: Never true    Ran Out of Food in the Last Year: Never true  Transportation Needs: No Transportation Needs (10/13/2023)   PRAPARE - Administrator, Civil Service (Medical): No    Lack of Transportation (Non-Medical): No  Physical Activity: Insufficiently Active (10/13/2023)   Exercise Vital Sign    Days of Exercise per Week: 4 days    Minutes of Exercise per Session: 30 min  Stress: Stress Concern Present (10/13/2023)   [employer] of Occupational Health - Occupational Stress Questionnaire    Feeling of Stress : To some extent  Social Connections:  Unknown (10/13/2023)   Social Connection and Isolation Panel [NHANES]    Frequency of Communication with Friends and Family: More than three times a week    Frequency of Social Gatherings with Friends and Family: More than three times a week    Attends Religious Services: Never    Database administrator or Organizations: No    Attends Banker Meetings: Never    Marital Status: Not on file  Intimate Partner Violence: Not At Risk (10/13/2023)   Humiliation, Afraid, Rape, and Kick questionnaire    Fear of Current or Ex-Partner: No    Emotionally Abused: No    Physically Abused: No    Sexually Abused: No    Review of Systems: ROS negative except for what is noted on the assessment and plan.  Objective:   Vitals:   10/13/23 0946 10/13/23 1048  BP: (!) 147/93 (!) 161/79  Pulse: 76 (!) 57  Temp: 98.7 F (37.1 C)   TempSrc: Oral   SpO2: 96%   Weight: 225 lb 3.2 oz (102.2 kg)   Height: 5\' 2"  (1.575 m)     Physical Exam: Constitutional: well-appearing, in no acute distress HENT: no thyroid  nodules or thyroidmegaly appreciated Cardiovascular: regular rate and rhythm, no m/r/g Pulmonary/Chest: normal work of breathing on room air, lungs clear to auscultation bilaterally MSK: normal bulk and tone Skin: warm and dry  Assessment & Plan:  Panic attack Presents with 6 month history of 2-3 panic attacks daily. Denies suicidal or homicidal ideation. GAD elevated at 17. She is open to therapy and medications.  A/P: I want to rule out hyperthyroidism or anemia contributing to presentation so will get TSH and CBC today.  -referral to Behavioral Health placed -start lexapro 5 mg, talked with her that it would take a few weeks before she may note improvement, follow-up in 4 weeks with plans to increase medication if GAD score doesn't improve or continues to have multiple panic attacks daily -hydroxyzine 25 mg TID PRN for panic attack or help with sleep  Hypertension Blood pressure  elevated at 147/93 to 161/79. She has known history of hypertension and was started on olmesartan  at last visit. Majority of visit focused on panic attacks. P: Plan to recheck BP in 4 weeks and likely start ARB which she was prescribed in the past. Would screen for sleep apnea at follow-up  BMI 40.0-44.9, adult [facility]) She remains interested in medications for help with weight loss. She has lost 12 lbs since last visit with keto diet and increasing walking. P: Wegovy  sent, will need to see if Prior auth approved She is open to weight management clinic if this is not approved.   Patient discussed with Dr. Loman Risk Kemar Pandit, D.O. [facility]  PGY-3 Pager: [phone]  Phone: [phone] Date 10/13/2023  Time 1:05 PM

## 2023-10-13 NOTE — Assessment & Plan Note (Addendum)
 Blood pressure elevated at 147/93 to 161/79. She has known history of hypertension and was started on olmesartan  at last visit. Majority of visit focused on panic attacks. P: Plan to recheck BP in 4 weeks and likely start ARB which she was prescribed in the past. Would screen for sleep apnea at follow-up

## 2023-10-13 NOTE — Telephone Encounter (Signed)
 Prior Authorization for patient (Wegovy  0.25MG /0.5ML auto-injectors) came through on cover my meds was submitted with last office notes and labs awaiting approval or denial.  KEY:[id number]

## 2023-10-13 NOTE — Patient Instructions (Signed)
 Thank you, [name] for allowing us  to provide your care today.   Panic attacks  I am checking blood work for thyroid  and blood count just to make sure these are not contrinuting to your symptoms.   Please call our clinic next week if you have not heard about behavioral health appointment.  I sent in lexapro, which is a daily medication for help with symptoms. Please follow-up in 4 weeks and we may increase the dose.  Atarax is a medication you can take as needed until therapy and lexapro help.  If you start having thoughts of hurting yourself please go to [facility] or emergency room.  Weight loss I sent in wegovy  and the office will be submitting pre auths for this.  I have ordered the following labs for you:  Lab Orders         TSH         CBC no Diff      Referrals ordered today:   Referral Orders         Ambulatory referral to Integrated Behavioral Health       I have ordered the following medication/changed the following medications:   Stop the following medications: There are no discontinued medications.   Start the following medications: Meds ordered this encounter  Medications   escitalopram (LEXAPRO) 5 MG tablet    Sig: Take 1 tablet (5 mg total) by mouth daily.    Dispense:  30 tablet    Refill:  3   hydrOXYzine (ATARAX) 25 MG tablet    Sig: Take 1 tablet (25 mg total) by mouth 3 (three) times daily as needed.    Dispense:  30 tablet    Refill:  0   Semaglutide -Weight Management 0.25 MG/0.5ML SOAJ    Sig: Inject 0.25 mg into the skin once a week for 28 days.    Dispense:  2 mL    Refill:  0   Semaglutide -Weight Management 0.5 MG/0.5ML SOAJ    Sig: Inject 0.5 mg into the skin once a week for 28 days.    Dispense:  2 mL    Refill:  0   Semaglutide -Weight Management 1 MG/0.5ML SOAJ    Sig: Inject 1 mg into the skin once a week for 28 days.    Dispense:  2 mL    Refill:  0   Semaglutide -Weight Management 1.7  MG/0.75ML SOAJ    Sig: Inject 1.7 mg into the skin once a week for 28 days.    Dispense:  3 mL    Refill:  0   Semaglutide -Weight Management 2.4 MG/0.75ML SOAJ    Sig: Inject 2.4 mg into the skin once a week for 28 days.    Dispense:  3 mL    Refill:  0     Follow up: 1 month to follow-up on panic attacks   We look forward to seeing you next time. Please call our clinic at [phone] if you have any questions or concerns. The best time to call is Monday-Friday from 9am-4pm, but there is someone available 24/7. If after hours or the weekend, call the main hospital number and ask for the Internal Medicine Resident On-Call. If you need medication refills, please notify your pharmacy one week in advance and they will send us  a request.   Thank you for trusting me with your care. Wishing you the best!   Karalee Oscar, DO [facility]

## 2023-10-14 ENCOUNTER — Ambulatory Visit (INDEPENDENT_AMBULATORY_CARE_PROVIDER_SITE_OTHER): Admitting: Licensed Clinical Social Worker

## 2023-10-14 ENCOUNTER — Telehealth (INDEPENDENT_AMBULATORY_CARE_PROVIDER_SITE_OTHER): Payer: Self-pay | Admitting: Licensed Clinical Social Worker

## 2023-10-14 DIAGNOSIS — F410 Panic disorder [episodic paroxysmal anxiety] without agoraphobia: Secondary | ICD-10-CM

## 2023-10-14 LAB — CBC
Hematocrit: 41.8 % (ref 34.0–46.6)
Hemoglobin: 13.9 g/dL (ref 11.1–15.9)
MCH: 32.3 pg (ref 26.6–33.0)
MCHC: 33.3 g/dL (ref 31.5–35.7)
MCV: 97 fL (ref 79–97)
Platelets: 343 10*3/uL (ref 150–450)
RBC: 4.3 x10E6/uL (ref 3.77–5.28)
RDW: 12.8 % (ref 11.7–15.4)
WBC: 9.2 10*3/uL (ref 3.4–10.8)

## 2023-10-14 LAB — TSH: TSH: 1.22 u[IU]/mL (ref 0.450–4.500)

## 2023-10-14 NOTE — BH Specialist Note (Signed)
 Patient no-showed today's appointment; appointment was for Telephone visit at 9:00am  Behavioral Health Clinician [facility] from here on out)  attempted patient via Telephone . [facility] left a  VM.   Patient will need to reschedule appointment by calling Internal medicine center [phone].  Amie Bald, MSW, LCSW-A She/Her Behavioral Health Clinician [facility]  Internal Medicine Center Direct [phone]  Fax [phone] Main Office Phone: [phone] [address] [place] [place], [place] [address] Website: [facility] Internal Medicine [facility]  [place], [place]  [place]

## 2023-10-14 NOTE — Telephone Encounter (Signed)
 Amie Bald, [facility], LCSW-A attempted patient on today regarding 9 am consultation appointment for counseling and left a Vm for patient to contact agency back.   Amie Bald, MSW, LCSW-A She/Her Behavioral Health Clinician [facility]  Internal Medicine Center Direct Dial:[phone]  Fax [phone] Main Office Phone: [phone] [address] [place] [place], [place] [address] Website: [facility] Internal Medicine [facility]  [place], [place]  [place]

## 2023-10-14 NOTE — Progress Notes (Signed)
 Internal Medicine Clinic Attending  Case discussed with the resident at the time of the visit.  We reviewed the resident's history and exam and pertinent patient test results.  I agree with the assessment, diagnosis, and plan of care documented in the resident's note.

## 2023-10-17 ENCOUNTER — Ambulatory Visit: Payer: Self-pay | Admitting: Internal Medicine

## 2023-11-16 ENCOUNTER — Encounter: Payer: Self-pay | Admitting: *Deleted

## 2023-12-08 ENCOUNTER — Other Ambulatory Visit: Payer: Self-pay

## 2023-12-08 DIAGNOSIS — F410 Panic disorder [episodic paroxysmal anxiety] without agoraphobia: Secondary | ICD-10-CM

## 2023-12-08 MED ORDER — HYDROXYZINE HCL 25 MG PO TABS: 25.0000 mg | ORAL_TABLET | Freq: Three times a day (TID) | ORAL | 0 refills | Status: DC | PRN

## 2023-12-08 NOTE — Telephone Encounter (Signed)
 Want to follow-up in the office or by phone to check on the course of her panic attacks, the hydroxyzine  was meant for a short course.  Meds ordered this encounter  Medications   hydrOXYzine  (ATARAX ) 25 MG tablet    Sig: Take 1 tablet (25 mg total) by mouth 3 (three) times daily as needed.    Dispense:  30 tablet    Refill:  0   Ozell Kung MD 12/08/2023, 5:33 PM

## 2023-12-09 NOTE — Telephone Encounter (Signed)
 Appointment has been scheduled.

## 2023-12-10 ENCOUNTER — Ambulatory Visit: Admitting: Student

## 2023-12-10 DIAGNOSIS — F410 Panic disorder [episodic paroxysmal anxiety] without agoraphobia: Secondary | ICD-10-CM | POA: Diagnosis not present

## 2023-12-10 DIAGNOSIS — R42 Dizziness and giddiness: Secondary | ICD-10-CM

## 2023-12-10 DIAGNOSIS — J302 Other seasonal allergic rhinitis: Secondary | ICD-10-CM | POA: Diagnosis not present

## 2023-12-10 MED ORDER — CETIRIZINE HCL 10 MG PO TABS: 10.0000 mg | ORAL_TABLET | Freq: Every day | ORAL | Status: DC

## 2023-12-10 MED ORDER — HYDROXYZINE HCL 25 MG PO TABS: 25.0000 mg | ORAL_TABLET | Freq: Three times a day (TID) | ORAL | 0 refills | Status: DC | PRN

## 2023-12-10 NOTE — Progress Notes (Signed)
 Internal Medicine Clinic Attending  Case discussed with the resident at the time of the visit.  We reviewed the resident's history and exam and pertinent patient test results.  I agree with the assessment, diagnosis, and plan of care documented in the resident's note.

## 2023-12-10 NOTE — Assessment & Plan Note (Addendum)
 Some of her ear symptoms may be explained by allergies.

## 2023-12-10 NOTE — Patient Instructions (Addendum)
 Return in about 1 week (around 12/17/2023) for dizziness and ear fullness.  Try the Epley Maneuver at home this week to see if your symptoms improve with this.  Stop the Lexapro .  Remember to bring all of the medications that you take (including over the counter medications and supplements) with you to every clinic visit.  This after visit summary is an important review of tests, referrals, and medication changes that were discussed during your visit. If you have questions or concerns, call [phone]. Outside of clinic business hours, call the main hospital at [phone] and ask the operator for the on-call internal medicine resident.   Ozell Kung MD 12/10/2023, 9:35 AM

## 2023-12-10 NOTE — Assessment & Plan Note (Signed)
 Due to dizzy spells. Continue hydroxyzine  for symptom relief while working up underlying vertigo. Stop Lexapro .

## 2023-12-10 NOTE — Progress Notes (Signed)
  Patient name: Anna Harrison Date of birth: [birth year] Date of visit: 12/10/23  This visit was conducted via a synchronous, secure telehealth platform using real-time audio only communication.   The patient was located in [place] at home, and I was in the Internal Medicine Center at the time of the visit.  Patient identity and location were verified at the start of the encounter. Verbal consent was obtained for the telehealth visit, including discussion of the risks, benefits, and limitations of remote care.   This encounter was conducted in accordance with applicable federal and state telehealth laws and payer-specific guidelines.  I spent 25 minutes on the phone with the patient.  Subjective   Chief concern: feeling dizzy  Dizzy spells, feels like she's on a boat even though she's not moving. Happen 1-2 times per day. Last 5-6 minutes. Ongoing for 2 months. Happen at home, when she's out, when she's at work. No identifiable trigger like position change.  Dizziness starts prior to panicky feeling.  Notes hydroxyzine , prescribed for panic attacks, has helped sleep somewhat.  Recently she's had some ear symptoms, mostly left ear that feels clogged, sometimes it's both ears though. Associated with hearing decrease that improves when ears aren't clogged.   Review of Systems  Constitutional:  Negative for chills and fever.  HENT:  Positive for hearing loss and tinnitus. Negative for congestion and sore throat.        Ear fullness  Respiratory:  Positive for cough (nighttime cough).   Gastrointestinal:  Positive for nausea.  Neurological:  Positive for dizziness and headaches.    Current Outpatient Medications  Medication Instructions   cetirizine  (ZYRTEC  ALLERGY) 10 mg, Oral, Daily   hydrOXYzine  (ATARAX ) 25 mg, Oral, 3 times daily PRN   olmesartan  (BENICAR ) 10 mg, Oral, Daily   pantoprazole  (PROTONIX ) 40 mg, Oral, Daily, LAST REFILL - NEEDS TO MAKE AN APPT WITH PCP    Semaglutide -Weight Management 1 mg, Subcutaneous, Weekly   [START ON 01/08/2024] Semaglutide -Weight Management 1.7 mg, Subcutaneous, Weekly   [START ON 02/06/2024] Semaglutide -Weight Management 2.4 mg, Subcutaneous, Weekly   Wegovy  0.25 mg, Subcutaneous, Weekly     Assessment & Plan   Vertigo Assessment & Plan: Symptoms are more consistent with vertigo than panic disorder. Her dizziness precedes her anxiety and she's anxious because of her dizzy symptoms rather than the other way around. I think treating the vertigo as the primary disorder makes the most sense. Symptoms atypical for BPPV but I recommend trial of Epley maneuver at home for a week to see if this improves things. Her ear fullness and hearing loss suggest Meniere's, as such I recommend coming to the clinic for an exam including Dix-Hallpike and otoscopic exam. In the meantime I'm starting a course of cetirizine  to see in case some of these symptoms are due to seasonal allergies.   Panic attacks Assessment & Plan: Due to dizzy spells. Continue hydroxyzine  for symptom relief while working up underlying vertigo. Stop Lexapro .  Orders: -     hydrOXYzine  HCl; Take 1 tablet (25 mg total) by mouth 3 (three) times daily as needed.  Dispense: 30 tablet; Refill: 0  Seasonal allergies Assessment & Plan: Some of her ear symptoms may be explained by allergies.  Orders: -     Cetirizine  HCl; Take 1 tablet (10 mg total) by mouth daily.    Return in about 1 week (around 12/17/2023) for dizziness and ear fullness.  Ozell Kung MD 12/10/2023, 9:44 AM

## 2023-12-10 NOTE — Assessment & Plan Note (Signed)
 Symptoms are more consistent with vertigo than panic disorder. Her dizziness precedes her anxiety and she's anxious because of her dizzy symptoms rather than the other way around. I think treating the vertigo as the primary disorder makes the most sense. Symptoms atypical for BPPV but I recommend trial of Epley maneuver at home for a week to see if this improves things. Her ear fullness and hearing loss suggest Meniere's, as such I recommend coming to the clinic for an exam including Dix-Hallpike and otoscopic exam. In the meantime I'm starting a course of cetirizine  to see in case some of these symptoms are due to seasonal allergies.

## 2023-12-17 ENCOUNTER — Other Ambulatory Visit: Payer: Self-pay | Admitting: Student

## 2023-12-17 DIAGNOSIS — F410 Panic disorder [episodic paroxysmal anxiety] without agoraphobia: Secondary | ICD-10-CM

## 2023-12-18 NOTE — Telephone Encounter (Signed)
 Appointment has been scheduled.

## 2023-12-22 ENCOUNTER — Encounter: Admitting: Student

## 2023-12-26 ENCOUNTER — Encounter: Admitting: Student

## 2023-12-30 ENCOUNTER — Encounter (HOSPITAL_COMMUNITY): Payer: Self-pay | Admitting: Emergency Medicine

## 2023-12-30 ENCOUNTER — Other Ambulatory Visit: Payer: Self-pay

## 2023-12-30 ENCOUNTER — Emergency Department (HOSPITAL_COMMUNITY)
Admission: EM | Admit: 2023-12-30 | Discharge: 2023-12-31 | Disposition: A | Discharge status: Home / Self Care | Attending: Emergency Medicine | Admitting: Emergency Medicine

## 2023-12-30 DIAGNOSIS — R109 Unspecified abdominal pain: Secondary | ICD-10-CM | POA: Diagnosis present

## 2023-12-30 DIAGNOSIS — R1030 Lower abdominal pain, unspecified: Secondary | ICD-10-CM

## 2023-12-30 DIAGNOSIS — R1031 Right lower quadrant pain: Secondary | ICD-10-CM | POA: Diagnosis not present

## 2023-12-30 DIAGNOSIS — I10 Essential (primary) hypertension: Secondary | ICD-10-CM | POA: Diagnosis not present

## 2023-12-30 LAB — URINALYSIS, ROUTINE W REFLEX MICROSCOPIC
Bilirubin Urine: NEGATIVE
Glucose, UA: NEGATIVE mg/dL
Hgb urine dipstick: NEGATIVE
Ketones, ur: NEGATIVE mg/dL
Leukocytes,Ua: NEGATIVE
Nitrite: NEGATIVE
Protein, ur: NEGATIVE mg/dL
Specific Gravity, Urine: 1.014 (ref 1.005–1.030)
pH: 6 (ref 5.0–8.0)

## 2023-12-30 LAB — COMPREHENSIVE METABOLIC PANEL WITH GFR
ALT: 32 U/L (ref 0–44)
AST: 32 U/L (ref 15–41)
Albumin: 3.7 g/dL (ref 3.5–5.0)
Alkaline Phosphatase: 69 U/L (ref 38–126)
Anion gap: 11 (ref 5–15)
BUN: 9 mg/dL (ref 6–20)
CO2: 23 mmol/L (ref 22–32)
Calcium: 8.8 mg/dL — ABNORMAL LOW (ref 8.9–10.3)
Chloride: 106 mmol/L (ref 98–111)
Creatinine, Ser: 0.62 mg/dL (ref 0.44–1.00)
GFR, Estimated: >60 mL/min (ref >60)
Glucose, Bld: 114 mg/dL — ABNORMAL HIGH (ref 70–99)
Potassium: 3.7 mmol/L (ref 3.5–5.1)
Sodium: 140 mmol/L (ref 135–145)
Total Bilirubin: 0.5 mg/dL (ref 0.0–1.2)
Total Protein: 7.2 g/dL (ref 6.5–8.1)

## 2023-12-30 LAB — CBC
HCT: 40.2 % (ref 36.0–46.0)
Hemoglobin: 13.5 g/dL (ref 12.0–15.0)
MCH: 31.8 pg (ref 26.0–34.0)
MCHC: 33.6 g/dL (ref 30.0–36.0)
MCV: 94.8 fL (ref 80.0–100.0)
Platelets: 328 K/uL (ref 150–400)
RBC: 4.24 MIL/uL (ref 3.87–5.11)
RDW: 13 % (ref 11.5–15.5)
WBC: 11 K/uL — ABNORMAL HIGH (ref 4.0–10.5)
nRBC: 0 % (ref 0.0–0.2)

## 2023-12-30 LAB — LIPASE, BLOOD: Lipase: 32 U/L (ref 11–51)

## 2023-12-30 NOTE — ED Triage Notes (Signed)
 Pt via POV c/o pelvic pain and right lower flank pain since 2 days ago. Denies urinary symptoms. Hx microdiscectomy last June to sacral area. Pt feels dizzy but denies n/v/d.

## 2023-12-30 NOTE — ED Provider Notes (Signed)
 [place] EMERGENCY DEPARTMENT AT [facility] Provider Note   CSN: [id number] Arrival date & time: 12/30/23  2103     Patient presents with: Abdominal Pain   Anna Harrison is a 33 y.o. female.  {Add pertinent medical, surgical, social history, OB history to YEP:67052}  Abdominal Pain Patient presents for**.  Medical history includes HTN, GERD.  For the past 2 days, she has had pelvic and right lower flank pain.     Prior to Admission medications   Medication Sig Start Date End Date Taking? Authorizing Provider  cetirizine  (ZYRTEC  ALLERGY) 10 MG tablet Take 1 tablet (10 mg total) by mouth daily. 12/10/23 12/09/24  Norrine Sharper, MD  hydrOXYzine  (ATARAX ) 25 MG tablet TAKE 1 TABLET(25 MG) BY MOUTH THREE TIMES DAILY AS NEEDED 12/17/23   Norrine Sharper, MD  olmesartan  (BENICAR ) 20 MG tablet Take 0.5 tablets (10 mg total) by mouth daily. 12/28/21 06/26/22  Atway, Rayann N, DO  pantoprazole  (PROTONIX ) 40 MG tablet TAKE 1 TABLET (40 MG TOTAL) BY MOUTH DAILY. LAST REFILL - NEEDS TO MAKE AN APPT WITH PCP 10/10/23   Atway, Rayann N, DO  Semaglutide -Weight Management (WEGOVY ) 0.25 MG/0.5ML SOAJ Inject 0.25 mg into the skin once a week. 01/02/22   Atway, Rayann N, DO  Semaglutide -Weight Management 1 MG/0.5ML SOAJ Inject 1 mg into the skin once a week for 28 days. 12/10/23 01/07/24  Masters, Katie, DO  Semaglutide -Weight Management 1.7 MG/0.75ML SOAJ Inject 1.7 mg into the skin once a week for 28 days. 01/08/24 02/05/24  Masters, Izetta, DO  Semaglutide -Weight Management 2.4 MG/0.75ML SOAJ Inject 2.4 mg into the skin once a week for 28 days. 02/06/24 03/05/24  Masters, Katie, DO    Allergies: Patient has no known allergies.    Review of Systems  Gastrointestinal:  Positive for abdominal pain.    Updated Vital Signs BP (!) 160/108 (BP Location: Right Arm)   Pulse 95   Temp 98.9 F (37.2 C) (Oral)   Resp 18   Ht 5' 2 (1.575 m)   Wt 99.8 kg   LMP 12/14/2023 (Approximate)   SpO2  100%   Breastfeeding No   BMI 40.24 kg/m   Physical Exam  (all labs ordered are listed, but only abnormal results are displayed) Labs Reviewed  COMPREHENSIVE METABOLIC PANEL WITH GFR - Abnormal; Notable for the following components:      Result Value   Glucose, Bld 114 (*)    Calcium 8.8 (*)    All other components within normal limits  CBC - Abnormal; Notable for the following components:   WBC 11.0 (*)    All other components within normal limits  URINALYSIS, ROUTINE W REFLEX MICROSCOPIC  LIPASE, BLOOD  PREGNANCY, URINE    EKG: None  Radiology: No results found.  {Document cardiac monitor, telemetry assessment procedure when appropriate:32947} Procedures   Medications Ordered in the ED - No data to display    {Click here for ABCD2, HEART and other calculators REFRESH Note before signing:1}                              Medical Decision Making Amount and/or Complexity of Data Reviewed Labs: ordered.   ***  {Document critical care time when appropriate  Document review of labs and clinical decision tools ie CHADS2VASC2, etc  Document your independent review of radiology images and any outside records  Document your discussion with family members, caretakers and with consultants  Document  social determinants of health affecting pt's care  Document your decision making why or why not admission, treatments were needed:32947:::1}   Final diagnoses:  None    ED Discharge Orders     None

## 2023-12-31 ENCOUNTER — Emergency Department (HOSPITAL_COMMUNITY)

## 2023-12-31 ENCOUNTER — Encounter: Admitting: Student

## 2023-12-31 LAB — PREGNANCY, URINE: Preg Test, Ur: NEGATIVE

## 2023-12-31 MED ORDER — METHOCARBAMOL 500 MG PO TABS: 500.0000 mg | ORAL_TABLET | Freq: Three times a day (TID) | ORAL | 0 refills | Status: DC | PRN

## 2023-12-31 MED ORDER — LACTATED RINGERS IV BOLUS
1000.0000 mL | Freq: Once | INTRAVENOUS | Status: AC
  Administered 2023-12-31: 1000 mL via INTRAVENOUS

## 2023-12-31 MED ORDER — HYDROMORPHONE HCL 1 MG/ML IJ SOLN
0.5000 mg | Freq: Once | INTRAMUSCULAR | Status: AC
  Administered 2023-12-31: 0.5 mg via INTRAVENOUS
  Filled 2023-12-31: qty 0.5

## 2023-12-31 MED ORDER — IOHEXOL 300 MG/ML  SOLN
100.0000 mL | Freq: Once | INTRAMUSCULAR | Status: AC | PRN
  Administered 2023-12-31: 100 mL via INTRAVENOUS

## 2023-12-31 MED ORDER — KETOROLAC TROMETHAMINE 15 MG/ML IJ SOLN
15.0000 mg | Freq: Once | INTRAMUSCULAR | Status: AC
  Administered 2023-12-31: 15 mg via INTRAVENOUS
  Filled 2023-12-31: qty 1

## 2023-12-31 MED ORDER — OXYCODONE-ACETAMINOPHEN 5-325 MG PO TABS
1.0000 | ORAL_TABLET | Freq: Once | ORAL | Status: AC
  Administered 2023-12-31: 1 via ORAL
  Filled 2023-12-31: qty 1

## 2023-12-31 MED ORDER — OXYCODONE-ACETAMINOPHEN 5-325 MG PO TABS
1.0000 | ORAL_TABLET | Freq: Three times a day (TID) | ORAL | 0 refills | Status: AC | PRN
Start: 2023-12-31 — End: 2024-01-03

## 2023-12-31 MED ORDER — ONDANSETRON HCL 4 MG/2ML IJ SOLN
4.0000 mg | Freq: Once | INTRAMUSCULAR | Status: AC
  Administered 2023-12-31: 4 mg via INTRAVENOUS
  Filled 2023-12-31: qty 2

## 2023-12-31 MED ORDER — METHOCARBAMOL 500 MG PO TABS
1000.0000 mg | ORAL_TABLET | Freq: Once | ORAL | Status: AC
  Administered 2023-12-31: 1000 mg via ORAL
  Filled 2023-12-31: qty 2

## 2023-12-31 MED ORDER — ONDANSETRON 4 MG PO TBDP: 4.0000 mg | ORAL_TABLET | Freq: Three times a day (TID) | ORAL | 0 refills | Status: AC | PRN

## 2023-12-31 NOTE — Discharge Instructions (Signed)
 Your test results today are reassuring.  Continue pain and nausea medicine as needed.  Take ibuprofen  and Tylenol  every 6 hours.  Prescriptions for prescription pain medications were sent to your pharmacy.  Take these only as needed.  A prescription for nausea medicine was sent as well.  Drink plain fluids to stay hydrated.  Return for any new or worsening symptoms of concern.

## 2024-03-02 NOTE — Patient Instructions (Incomplete)
 Thank you, Ms.Alan Rod for allowing us  to provide your care today. Today we discussed your medications and refilling them today, as well as your anxiety.  We discussed use of a TENS unit for your nerve pain. You can   Referrals: - Gastroenterology for your reflux symptoms.   New medications: -Wegovy  2.4mg  weekly -Protonix  40mg  twice daily for your reflux symptoms -Cymbalta 30mg  for one week with breakfast -Cymbalta 60mg  daily with breakfast. -Propanolol 10mg  as needed for when you notice your triggers come on with your anxiety.     My Chart Access: [email or web address]  Please follow-up in: 6 weeks to check how your new medications are doing for you    We look forward to seeing you next time. Please call our clinic at [phone] if you have any questions or concerns. The best time to call is Monday-Friday from 9am-4pm, but there is someone available 24/7. If after hours or the weekend, call the main hospital number and ask for the Internal Medicine Resident On-Call. If you need medication refills, please notify your pharmacy one week in advance and they will send us  a request.   Thank you for letting us  take part in your care. Wishing you the best!  Sael Furches, DO  03/03/2024, 9:50 AM [facility]

## 2024-03-03 ENCOUNTER — Ambulatory Visit: Admitting: Student

## 2024-03-03 ENCOUNTER — Other Ambulatory Visit: Payer: Self-pay

## 2024-03-03 ENCOUNTER — Ambulatory Visit

## 2024-03-03 VITALS — BP 119/89 | HR 99 | Temp 98.7°F | Ht 62.0 in | Wt 219.2 lb

## 2024-03-03 DIAGNOSIS — Z6841 Body Mass Index (BMI) 40.0 and over, adult: Secondary | ICD-10-CM

## 2024-03-03 DIAGNOSIS — F410 Panic disorder [episodic paroxysmal anxiety] without agoraphobia: Secondary | ICD-10-CM

## 2024-03-03 DIAGNOSIS — K219 Gastro-esophageal reflux disease without esophagitis: Secondary | ICD-10-CM

## 2024-03-03 DIAGNOSIS — I10 Essential (primary) hypertension: Secondary | ICD-10-CM

## 2024-03-03 MED ORDER — PANTOPRAZOLE SODIUM 40 MG PO TBEC: 40.0000 mg | DELAYED_RELEASE_TABLET | Freq: Two times a day (BID) | ORAL | 2 refills | Status: AC

## 2024-03-03 MED ORDER — PROPRANOLOL HCL 10 MG PO TABS: 10.0000 mg | ORAL_TABLET | Freq: Two times a day (BID) | ORAL | 11 refills | Status: AC

## 2024-03-03 MED ORDER — DULOXETINE HCL 60 MG PO CPEP: 60.0000 mg | ORAL_CAPSULE | Freq: Every day | ORAL | 3 refills | Status: DC

## 2024-03-03 MED ORDER — DULOXETINE HCL 30 MG PO CPEP: 30.0000 mg | ORAL_CAPSULE | Freq: Every day | ORAL | 0 refills | Status: DC

## 2024-03-03 MED ORDER — SEMAGLUTIDE-WEIGHT MANAGEMENT 2.4 MG/0.75ML ~~LOC~~ SOAJ: 2.4000 mg | SUBCUTANEOUS | 3 refills | Status: DC

## 2024-03-03 NOTE — Assessment & Plan Note (Addendum)
 Chronic, worsening. Has been using Hydroxyzine  at night to help with her panic attacks as well as her chronic pain. She describes this pain as burning near her neck and upper back. Panic attacks described as sudden onset with palpitations and discomfort, and patient has to ground herself to allow moment to pass. Her pain serves as a trigger for her anxiety, but does describe much of her anxiety as a mental block. Query panic attacks vs development of GAD with persistent anxiety. Previously trialed Lexapro , which did not work for her. Hydroxyzine  currently works, however is sedating, and she would like to start a medication that can help manage her anxiety longitudinally without sedative effects. Has tried her mother's prescription Xanax once, which helped, but would like to trial a medicine with less risks. Discussed Buspar vs Cymbalta and how both medicines can treat anxiety, but Cymbalta can also treat neuropathic pain. Also discussed starting Propanolol that she can use during the day to help with her symptoms when she feels an attack oncoming, or knows she will have to face a known trigger.   - Will start Cymbalta 30mg  once daily for 1 week, then uptitrate to 60mg  once daily  - Plan to call patient in 2 weeks to check in on medication effects on anxiety/pain managment - Follow up in 6 weeks to monitor anxiety symptoms and need for further management. If no change, consider Buspar or other SSRI - Propanolol 10mg  for abortive therapy for panic attack triggers

## 2024-03-03 NOTE — Assessment & Plan Note (Addendum)
 BP today 119/89. Previously had been on Olmesartan  20mg , but stopped this some time ago. Patient been making active lifestyle efforts recently and has been losing weight. Will continue to monitor for now.

## 2024-03-03 NOTE — Assessment & Plan Note (Signed)
 Chronic, worsening. Patient has been taking Protonix  40mg  daily for some time, and occasionally has to self-increase dose to BID. Previously been referred to GI, but has not been able to go due to work and scheduling. Will send referral again, as patient would like to assess what is causing trouble swallowing foods and her heartburn. Denies choking, but she is unsure why her reflux is worsening if she is losing weight.   - Will increase Protonix  40mg  to BID - Sent referral to [facility] for possible EGD to rule out stricture

## 2024-03-03 NOTE — Progress Notes (Signed)
 Established Patient Office Visit  Subjective   Patient ID: Anna Harrison, female    DOB: 08/15/1990  Age: 33 y.o. MRN: [id number]  Chief Complaint  Patient presents with   Follow-up    Follow up for bp / medication refill / chronic upper back pain / panic attack / requesting something for panic attack.    Anna Harrison is a 33 year old female with a past medical history of HTN, panic attacks, and GERD who presents to clinic today for medication refills. Please see problem-based assessment and plan below for details.    Review of Systems  Constitutional: Negative.   Eyes: Negative.   Respiratory: Negative.    Cardiovascular: Negative.   Gastrointestinal: Negative.   Genitourinary: Negative.   Musculoskeletal:  Positive for neck pain.  Neurological:  Positive for dizziness. Negative for tremors.  Psychiatric/Behavioral:  The patient is nervous/anxious.       Objective:    BP 119/89 (BP Location: Left Arm, Patient Position: Sitting, Cuff Size: Large)   Pulse 99   Temp 98.7 F (37.1 C) (Oral)   Ht 5' 2 (1.575 m)   Wt 219 lb 3.2 oz (99.4 kg)   SpO2 96%   BMI 40.09 kg/m   Physical Exam Constitutional:      Appearance: Normal appearance.  HENT:     Mouth/Throat:     Mouth: Mucous membranes are moist.  Cardiovascular:     Rate and Rhythm: Normal rate and regular rhythm.     Pulses: Normal pulses.  Pulmonary:     Effort: Pulmonary effort is normal.     Breath sounds: Normal breath sounds.  Abdominal:     General: Abdomen is flat.     Palpations: Abdomen is soft.  Musculoskeletal:     Cervical back: Normal range of motion.  Skin:    General: Skin is warm and dry.  Neurological:     General: No focal deficit present.     Mental Status: She is alert and oriented to person, place, and time.  Psychiatric:        Mood and Affect: Mood normal.        Behavior: Behavior normal.     The ASCVD Risk score (Arnett DK, et al., 2019) failed to calculate for the  following reasons:   The 2019 ASCVD risk score is only valid for ages 48 to 32    Assessment & Plan:   Patient discussed with Dr. Mliss Pouch.  Problem List Items Addressed This Visit       Cardiovascular and Mediastinum   Hypertension - Primary   BP today 119/89. Previously had been on Olmesartan  20mg , but stopped this some time ago. Patient been making active lifestyle efforts recently and has been losing weight. Will continue to monitor for now.       Relevant Medications   propranolol (INDERAL) 10 MG tablet     Digestive   GERD (gastroesophageal reflux disease)   Chronic, worsening. Patient has been taking Protonix  40mg  daily for some time, and occasionally has to self-increase dose to BID. Previously been referred to GI, but has not been able to go due to work and scheduling. Will send referral again, as patient would like to assess what is causing trouble swallowing foods and her heartburn. Denies choking, but she is unsure why her reflux is worsening if she is losing weight.   - Will increase Protonix  40mg  to BID - Sent referral to [facility] for possible EGD to rule out stricture  Relevant Medications   pantoprazole  (PROTONIX ) 40 MG tablet   Other Relevant Orders   Ambulatory referral to Gastroenterology     Other   BMI 40.0-44.9, adult [facility])   Prior Auth approved for Wegovy  to assist with weight loss and management. Injecting 1.7mg  weekly since June and feels as though she has hit a plateau. Has had a 6 pound weight difference since 10/2023. Informed patient she is still able to uptitrate to 2.4mg  injections weekly, and will resend prescription to pharmacy for her to start 2.4mg . Patient agreeable to plan and will start the 2.4mg  injections this Friday.  - Wegovy  2.4 mg weekly  - Check lipid profile at next visit      Relevant Medications   semaglutide -weight management (WEGOVY ) 2.4 MG/0.75ML SOAJ SQ injection   Panic attacks   Chronic, worsening. Has been  using Hydroxyzine  at night to help with her panic attacks as well as her chronic pain. She describes this pain as burning near her neck and upper back. Panic attacks described as sudden onset with palpitations and discomfort, and patient has to ground herself to allow moment to pass. Her pain serves as a trigger for her anxiety, but does describe much of her anxiety as a mental block. Query panic attacks vs development of GAD with persistent anxiety. Previously trialed Lexapro , which did not work for her. Hydroxyzine  currently works, however is sedating, and she would like to start a medication that can help manage her anxiety longitudinally without sedative effects. Has tried her mother's prescription Xanax once, which helped, but would like to trial a medicine with less risks. Discussed Buspar vs Cymbalta and how both medicines can treat anxiety, but Cymbalta can also treat neuropathic pain. Also discussed starting Propanolol that she can use during the day to help with her symptoms when she feels an attack oncoming, or knows she will have to face a known trigger.   - Will start Cymbalta 30mg  once daily for 1 week, then uptitrate to 60mg  once daily  - Plan to call patient in 2 weeks to check in on medication effects on anxiety/pain managment - Follow up in 6 weeks to monitor anxiety symptoms and need for further management. If no change, consider Buspar or other SSRI - Propanolol 10mg  for abortive therapy for panic attack triggers      Relevant Medications   propranolol (INDERAL) 10 MG tablet   DULoxetine (CYMBALTA) 30 MG capsule   DULoxetine (CYMBALTA) 60 MG capsule    Return in about 6 weeks (around 04/14/2024) for medication check and labwork.    Tylisa Alcivar, DO Internal Medicine Resident, PGY-1 11:46 AM 03/03/2024

## 2024-03-03 NOTE — Assessment & Plan Note (Addendum)
 Prior Auth approved for Wegovy  to assist with weight loss and management. Injecting 1.7mg  weekly since June and feels as though she has hit a plateau. Has had a 6 pound weight difference since 10/2023. Informed patient she is still able to uptitrate to 2.4mg  injections weekly, and will resend prescription to pharmacy for her to start 2.4mg . Patient agreeable to plan and will start the 2.4mg  injections this Friday.  - Wegovy  2.4 mg weekly  - Check lipid profile at next visit

## 2024-03-04 ENCOUNTER — Other Ambulatory Visit: Payer: Self-pay

## 2024-03-04 DIAGNOSIS — Z6841 Body Mass Index (BMI) 40.0 and over, adult: Secondary | ICD-10-CM

## 2024-03-04 NOTE — Progress Notes (Signed)
 Internal Medicine Clinic Attending  Case discussed with the resident at the time of the visit.  We reviewed the resident's history and exam and pertinent patient test results.  I agree with the assessment, diagnosis, and plan of care documented in the resident's note. Management may be challenging.  I agree that there is a significant chronic anxiety component and SSRI or SNRI may be beneficial.  The hydroxyzine  taken at night may be helping her relax and sleep but as it is short acting, it wouldn't be expected to help with reducing panic attacks unless they typically happen at night. The propanol may help minimize the sxs of a panic attack if she can take it when she first starts feeling symptoms.  She would greatly benefit from talk therapy as well.

## 2024-03-23 ENCOUNTER — Other Ambulatory Visit: Payer: Self-pay

## 2024-03-23 DIAGNOSIS — F410 Panic disorder [episodic paroxysmal anxiety] without agoraphobia: Secondary | ICD-10-CM

## 2024-03-25 ENCOUNTER — Emergency Department (HOSPITAL_BASED_OUTPATIENT_CLINIC_OR_DEPARTMENT_OTHER)

## 2024-03-25 ENCOUNTER — Emergency Department (HOSPITAL_BASED_OUTPATIENT_CLINIC_OR_DEPARTMENT_OTHER)
Admission: EM | Admit: 2024-03-25 | Discharge: 2024-03-25 | Discharge status: Against Medical Advice | Attending: Emergency Medicine | Admitting: Emergency Medicine

## 2024-03-25 ENCOUNTER — Other Ambulatory Visit: Payer: Self-pay

## 2024-03-25 DIAGNOSIS — Z5321 Procedure and treatment not carried out due to patient leaving prior to being seen by health care provider: Secondary | ICD-10-CM | POA: Insufficient documentation

## 2024-03-25 DIAGNOSIS — F410 Panic disorder [episodic paroxysmal anxiety] without agoraphobia: Secondary | ICD-10-CM

## 2024-03-25 DIAGNOSIS — R42 Dizziness and giddiness: Secondary | ICD-10-CM | POA: Diagnosis not present

## 2024-03-25 DIAGNOSIS — R200 Anesthesia of skin: Secondary | ICD-10-CM | POA: Diagnosis not present

## 2024-03-25 DIAGNOSIS — H538 Other visual disturbances: Secondary | ICD-10-CM | POA: Diagnosis not present

## 2024-03-25 DIAGNOSIS — R519 Headache, unspecified: Secondary | ICD-10-CM | POA: Diagnosis present

## 2024-03-25 DIAGNOSIS — G43909 Migraine, unspecified, not intractable, without status migrainosus: Secondary | ICD-10-CM

## 2024-03-25 MED ORDER — HYDROXYZINE HCL 25 MG PO TABS: 25.0000 mg | ORAL_TABLET | Freq: Three times a day (TID) | ORAL | 0 refills | Status: DC | PRN

## 2024-03-25 NOTE — ED Triage Notes (Signed)
 Pt reports being at work and having visual disturbance of flashing lines and then numbness to left fingers and headache.  Does endorse dizziness.

## 2024-05-31 ENCOUNTER — Encounter (INDEPENDENT_AMBULATORY_CARE_PROVIDER_SITE_OTHER): Payer: Self-pay | Admitting: *Deleted

## 2024-06-16 ENCOUNTER — Other Ambulatory Visit: Payer: Self-pay

## 2024-06-16 ENCOUNTER — Ambulatory Visit (INDEPENDENT_AMBULATORY_CARE_PROVIDER_SITE_OTHER): Admitting: Gastroenterology

## 2024-06-16 DIAGNOSIS — F410 Panic disorder [episodic paroxysmal anxiety] without agoraphobia: Secondary | ICD-10-CM
